# Patient Record
Sex: Female | Born: 1990 | Race: Black or African American | Hispanic: No | Marital: Married | Smoking: Current every day smoker
Health system: Southern US, Community
[De-identification: ages and names within clinical notes are randomized; demographics above are authoritative.]

## PROBLEM LIST (undated history)

## (undated) ENCOUNTER — Inpatient Hospital Stay (HOSPITAL_COMMUNITY): Payer: Self-pay

## (undated) DIAGNOSIS — R87619 Unspecified abnormal cytological findings in specimens from cervix uteri: Secondary | ICD-10-CM

## (undated) DIAGNOSIS — IMO0002 Reserved for concepts with insufficient information to code with codable children: Secondary | ICD-10-CM

## (undated) DIAGNOSIS — Z8619 Personal history of other infectious and parasitic diseases: Secondary | ICD-10-CM

## (undated) DIAGNOSIS — A6000 Herpesviral infection of urogenital system, unspecified: Secondary | ICD-10-CM

## (undated) DIAGNOSIS — O0930 Supervision of pregnancy with insufficient antenatal care, unspecified trimester: Secondary | ICD-10-CM

## (undated) DIAGNOSIS — I2699 Other pulmonary embolism without acute cor pulmonale: Secondary | ICD-10-CM

## (undated) HISTORY — DX: Herpesviral infection of urogenital system, unspecified: A60.00

## (undated) HISTORY — DX: Personal history of other infectious and parasitic diseases: Z86.19

## (undated) HISTORY — DX: Supervision of pregnancy with insufficient antenatal care, unspecified trimester: O09.30

---
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
## 2008-03-04 ENCOUNTER — Emergency Department (HOSPITAL_COMMUNITY): Admission: EM | Admit: 2008-03-04 | Discharge: 2008-03-04 | Payer: Self-pay | Admitting: Family Medicine

## 2008-06-06 ENCOUNTER — Emergency Department (HOSPITAL_COMMUNITY): Admission: EM | Admit: 2008-06-06 | Discharge: 2008-06-06 | Payer: Self-pay | Admitting: Family Medicine

## 2009-04-06 ENCOUNTER — Emergency Department (HOSPITAL_COMMUNITY): Admission: EM | Admit: 2009-04-06 | Discharge: 2009-04-06 | Payer: Self-pay | Admitting: Family Medicine

## 2009-07-17 ENCOUNTER — Emergency Department (HOSPITAL_COMMUNITY): Admission: EM | Admit: 2009-07-17 | Discharge: 2009-07-17 | Payer: Self-pay | Admitting: Emergency Medicine

## 2009-10-17 ENCOUNTER — Inpatient Hospital Stay (HOSPITAL_COMMUNITY): Admission: EM | Admit: 2009-10-17 | Discharge: 2009-10-18 | Payer: Self-pay | Admitting: Emergency Medicine

## 2009-10-17 ENCOUNTER — Ambulatory Visit: Payer: Self-pay | Admitting: Family Medicine

## 2009-10-17 ENCOUNTER — Emergency Department (HOSPITAL_COMMUNITY): Admission: EM | Admit: 2009-10-17 | Discharge: 2009-10-17 | Payer: Self-pay | Admitting: Family Medicine

## 2009-10-18 ENCOUNTER — Ambulatory Visit: Payer: Self-pay | Admitting: Psychiatry

## 2009-10-18 ENCOUNTER — Inpatient Hospital Stay (HOSPITAL_COMMUNITY): Admission: AD | Admit: 2009-10-18 | Discharge: 2009-10-22 | Payer: Self-pay | Admitting: Psychiatry

## 2009-12-03 ENCOUNTER — Ambulatory Visit: Payer: Self-pay | Admitting: Psychiatry

## 2009-12-22 ENCOUNTER — Emergency Department (HOSPITAL_COMMUNITY)
Admission: EM | Admit: 2009-12-22 | Discharge: 2009-12-22 | Payer: Self-pay | Source: Home / Self Care | Admitting: Emergency Medicine

## 2009-12-26 ENCOUNTER — Emergency Department (HOSPITAL_COMMUNITY)
Admission: EM | Admit: 2009-12-26 | Discharge: 2009-12-26 | Payer: Self-pay | Source: Home / Self Care | Admitting: Emergency Medicine

## 2010-01-04 ENCOUNTER — Emergency Department (HOSPITAL_COMMUNITY)
Admission: EM | Admit: 2010-01-04 | Discharge: 2010-01-04 | Payer: Self-pay | Source: Home / Self Care | Admitting: Family Medicine

## 2010-02-23 ENCOUNTER — Emergency Department (HOSPITAL_COMMUNITY)
Admission: EM | Admit: 2010-02-23 | Discharge: 2010-02-23 | Disposition: A | Discharge status: Home / Self Care | Payer: Medicaid Other | Attending: Emergency Medicine | Admitting: Emergency Medicine

## 2010-02-23 DIAGNOSIS — J069 Acute upper respiratory infection, unspecified: Secondary | ICD-10-CM | POA: Insufficient documentation

## 2010-02-23 DIAGNOSIS — J3489 Other specified disorders of nose and nasal sinuses: Secondary | ICD-10-CM | POA: Insufficient documentation

## 2010-03-22 ENCOUNTER — Emergency Department (HOSPITAL_COMMUNITY): Payer: Medicaid Other

## 2010-03-22 ENCOUNTER — Emergency Department (HOSPITAL_COMMUNITY)
Admission: EM | Admit: 2010-03-22 | Discharge: 2010-03-22 | Disposition: A | Discharge status: Home / Self Care | Payer: Medicaid Other | Attending: Internal Medicine | Admitting: Internal Medicine

## 2010-03-22 DIAGNOSIS — R109 Unspecified abdominal pain: Secondary | ICD-10-CM | POA: Insufficient documentation

## 2010-03-22 DIAGNOSIS — M545 Low back pain, unspecified: Secondary | ICD-10-CM | POA: Insufficient documentation

## 2010-03-22 DIAGNOSIS — O99891 Other specified diseases and conditions complicating pregnancy: Secondary | ICD-10-CM | POA: Insufficient documentation

## 2010-03-22 LAB — URINALYSIS, ROUTINE W REFLEX MICROSCOPIC
Hgb urine dipstick: NEGATIVE
Nitrite: NEGATIVE
Specific Gravity, Urine: 1.023 (ref 1.005–1.030)
Urobilinogen, UA: 1 mg/dL (ref 0.0–1.0)

## 2010-03-22 LAB — ABO/RH: ABO/RH(D): B POS

## 2010-03-22 LAB — HCG, QUANTITATIVE, PREGNANCY: hCG, Beta Chain, Quant, S: 92268 m[IU]/mL — ABNORMAL HIGH (ref <5)

## 2010-03-22 LAB — POCT PREGNANCY, URINE: Preg Test, Ur: POSITIVE

## 2010-03-26 ENCOUNTER — Emergency Department (HOSPITAL_COMMUNITY)
Admission: EM | Admit: 2010-03-26 | Discharge: 2010-03-26 | Disposition: A | Discharge status: Home / Self Care | Payer: Medicaid Other | Attending: Emergency Medicine | Admitting: Emergency Medicine

## 2010-03-26 DIAGNOSIS — O99891 Other specified diseases and conditions complicating pregnancy: Secondary | ICD-10-CM | POA: Insufficient documentation

## 2010-03-26 DIAGNOSIS — R109 Unspecified abdominal pain: Secondary | ICD-10-CM | POA: Insufficient documentation

## 2010-03-26 LAB — URINALYSIS, ROUTINE W REFLEX MICROSCOPIC
Bilirubin Urine: NEGATIVE
Glucose, UA: NEGATIVE mg/dL
Ketones, ur: NEGATIVE mg/dL
pH: 8 (ref 5.0–8.0)

## 2010-03-26 LAB — WET PREP, GENITAL: Clue Cells Wet Prep HPF POC: NONE SEEN

## 2010-03-27 ENCOUNTER — Inpatient Hospital Stay (HOSPITAL_COMMUNITY): Payer: Medicaid Other

## 2010-03-27 ENCOUNTER — Inpatient Hospital Stay (HOSPITAL_COMMUNITY)
Admission: AD | Admit: 2010-03-27 | Discharge: 2010-03-27 | Disposition: A | Discharge status: Home / Self Care | Payer: Medicaid Other | Source: Ambulatory Visit | Attending: Obstetrics & Gynecology | Admitting: Obstetrics & Gynecology

## 2010-03-27 DIAGNOSIS — R109 Unspecified abdominal pain: Secondary | ICD-10-CM | POA: Insufficient documentation

## 2010-03-27 DIAGNOSIS — O99891 Other specified diseases and conditions complicating pregnancy: Secondary | ICD-10-CM | POA: Insufficient documentation

## 2010-03-27 DIAGNOSIS — O9989 Other specified diseases and conditions complicating pregnancy, childbirth and the puerperium: Secondary | ICD-10-CM

## 2010-03-27 LAB — URINALYSIS, ROUTINE W REFLEX MICROSCOPIC
Nitrite: NEGATIVE
Specific Gravity, Urine: 1.025 (ref 1.005–1.030)
Urobilinogen, UA: 0.2 mg/dL (ref 0.0–1.0)

## 2010-03-27 LAB — GC/CHLAMYDIA PROBE AMP, GENITAL
Chlamydia, DNA Probe: NEGATIVE
GC Probe Amp, Genital: NEGATIVE

## 2010-04-01 LAB — URINALYSIS, ROUTINE W REFLEX MICROSCOPIC
Bilirubin Urine: NEGATIVE
Hgb urine dipstick: NEGATIVE
Ketones, ur: NEGATIVE mg/dL
Leukocytes, UA: NEGATIVE
Nitrite: NEGATIVE
Nitrite: NEGATIVE
Protein, ur: NEGATIVE mg/dL
Specific Gravity, Urine: 1.014 (ref 1.005–1.030)
Urobilinogen, UA: 0.2 mg/dL (ref 0.0–1.0)
pH: 7 (ref 5.0–8.0)
pH: 7.5 (ref 5.0–8.0)

## 2010-04-01 LAB — URINE CULTURE: Culture  Setup Time: 201112041957

## 2010-04-01 LAB — URINE MICROSCOPIC-ADD ON

## 2010-04-01 LAB — POCT PREGNANCY, URINE: Preg Test, Ur: NEGATIVE

## 2010-04-01 LAB — GC/CHLAMYDIA PROBE AMP, GENITAL: GC Probe Amp, Genital: NEGATIVE

## 2010-04-01 LAB — RPR: RPR Ser Ql: NONREACTIVE

## 2010-04-01 LAB — WET PREP, GENITAL

## 2010-04-03 LAB — DIFFERENTIAL
Basophils Relative: 0 % (ref 0–1)
Basophils Relative: 1 % (ref 0–1)
Eosinophils Absolute: 0.2 10*3/uL (ref 0.0–0.7)
Eosinophils Absolute: 0.3 10*3/uL (ref 0.0–0.7)
Eosinophils Relative: 4 % (ref 0–5)
Eosinophils Relative: 7 % — ABNORMAL HIGH (ref 0–5)
Lymphs Abs: 1.4 10*3/uL (ref 0.7–4.0)
Lymphs Abs: 2 10*3/uL (ref 0.7–4.0)
Monocytes Absolute: 0.3 10*3/uL (ref 0.1–1.0)
Monocytes Relative: 6 % (ref 3–12)
Monocytes Relative: 6 % (ref 3–12)
Neutrophils Relative %: 42 % — ABNORMAL LOW (ref 43–77)

## 2010-04-03 LAB — URINALYSIS, ROUTINE W REFLEX MICROSCOPIC
Glucose, UA: NEGATIVE mg/dL
Hgb urine dipstick: NEGATIVE
Specific Gravity, Urine: 1.014 (ref 1.005–1.030)
pH: 5 (ref 5.0–8.0)

## 2010-04-03 LAB — COMPREHENSIVE METABOLIC PANEL
ALT: 12 U/L (ref 0–35)
ALT: 17 U/L (ref 0–35)
AST: 13 U/L (ref 0–37)
AST: 20 U/L (ref 0–37)
Albumin: 4.2 g/dL (ref 3.5–5.2)
Alkaline Phosphatase: 42 U/L (ref 39–117)
Alkaline Phosphatase: 47 U/L (ref 39–117)
CO2: 26 mEq/L (ref 19–32)
Calcium: 8.5 mg/dL (ref 8.4–10.5)
GFR calc Af Amer: >60 mL/min (ref >60)
GFR calc Af Amer: >60 mL/min (ref >60)
GFR calc non Af Amer: >60 mL/min (ref >60)
Glucose, Bld: 115 mg/dL — ABNORMAL HIGH (ref 70–99)
Glucose, Bld: 97 mg/dL (ref 70–99)
Potassium: 3.6 mEq/L (ref 3.5–5.1)
Potassium: 3.8 mEq/L (ref 3.5–5.1)
Sodium: 138 mEq/L (ref 135–145)
Sodium: 141 mEq/L (ref 135–145)
Total Protein: 5.8 g/dL — ABNORMAL LOW (ref 6.0–8.3)
Total Protein: 6.9 g/dL (ref 6.0–8.3)

## 2010-04-03 LAB — CBC
HCT: 37.8 % (ref 36.0–46.0)
HCT: 39.8 % (ref 36.0–46.0)
Hemoglobin: 12.6 g/dL (ref 12.0–15.0)
Platelets: 231 10*3/uL (ref 150–400)
RDW: 13.3 % (ref 11.5–15.5)
WBC: 4.6 10*3/uL (ref 4.0–10.5)
WBC: 4.9 10*3/uL (ref 4.0–10.5)

## 2010-04-03 LAB — RAPID URINE DRUG SCREEN, HOSP PERFORMED
Amphetamines: NOT DETECTED
Barbiturates: NOT DETECTED
Benzodiazepines: NOT DETECTED
Opiates: NOT DETECTED

## 2010-04-03 LAB — ACETAMINOPHEN LEVEL
Acetaminophen (Tylenol), Serum: 87.8 ug/mL — ABNORMAL HIGH (ref 10–30)
Acetaminophen (Tylenol), Serum: <10 ug/mL — ABNORMAL LOW (ref 10–30)

## 2010-04-03 LAB — GLUCOSE, CAPILLARY

## 2010-04-03 LAB — TSH: TSH: 1.017 u[IU]/mL (ref 0.350–4.500)

## 2010-04-06 LAB — GC/CHLAMYDIA PROBE AMP, GENITAL
Chlamydia, DNA Probe: NEGATIVE
GC Probe Amp, Genital: NEGATIVE

## 2010-04-06 LAB — URINALYSIS, ROUTINE W REFLEX MICROSCOPIC
Glucose, UA: NEGATIVE mg/dL
Hgb urine dipstick: NEGATIVE
pH: 6 (ref 5.0–8.0)

## 2010-04-06 LAB — WET PREP, GENITAL
Trich, Wet Prep: NONE SEEN
Yeast Wet Prep HPF POC: NONE SEEN

## 2010-04-29 LAB — POCT URINALYSIS DIP (DEVICE)
Hgb urine dipstick: NEGATIVE
Ketones, ur: NEGATIVE mg/dL
Protein, ur: NEGATIVE mg/dL
Specific Gravity, Urine: 1.015 (ref 1.005–1.030)
Urobilinogen, UA: 0.2 mg/dL (ref 0.0–1.0)
pH: 6 (ref 5.0–8.0)

## 2010-04-29 LAB — URINE CULTURE: Colony Count: >=100000

## 2010-04-29 LAB — POCT PREGNANCY, URINE: Preg Test, Ur: NEGATIVE

## 2010-05-17 ENCOUNTER — Inpatient Hospital Stay (HOSPITAL_COMMUNITY)
Admission: AD | Admit: 2010-05-17 | Discharge: 2010-05-17 | Disposition: A | Discharge status: Home / Self Care | Payer: Medicaid Other | Source: Ambulatory Visit | Attending: Family Medicine | Admitting: Family Medicine

## 2010-05-17 DIAGNOSIS — N949 Unspecified condition associated with female genital organs and menstrual cycle: Secondary | ICD-10-CM | POA: Insufficient documentation

## 2010-05-17 DIAGNOSIS — O98519 Other viral diseases complicating pregnancy, unspecified trimester: Secondary | ICD-10-CM

## 2010-05-17 DIAGNOSIS — A6000 Herpesviral infection of urogenital system, unspecified: Secondary | ICD-10-CM

## 2010-05-17 LAB — CBC
MCH: 30.4 pg (ref 26.0–34.0)
MCV: 84.3 fL (ref 78.0–100.0)
Platelets: 178 10*3/uL (ref 150–400)
RDW: 13.4 % (ref 11.5–15.5)
WBC: 6.1 10*3/uL (ref 4.0–10.5)

## 2010-05-17 LAB — URINALYSIS, ROUTINE W REFLEX MICROSCOPIC
Hgb urine dipstick: NEGATIVE
Nitrite: NEGATIVE
Protein, ur: NEGATIVE mg/dL
Specific Gravity, Urine: 1.025 (ref 1.005–1.030)
Urobilinogen, UA: 1 mg/dL (ref 0.0–1.0)

## 2010-07-24 ENCOUNTER — Emergency Department (HOSPITAL_COMMUNITY)
Admission: EM | Admit: 2010-07-24 | Discharge: 2010-07-24 | Disposition: A | Discharge status: Home / Self Care | Payer: Medicaid Other | Attending: Emergency Medicine | Admitting: Emergency Medicine

## 2010-07-24 DIAGNOSIS — R109 Unspecified abdominal pain: Secondary | ICD-10-CM | POA: Insufficient documentation

## 2010-07-24 DIAGNOSIS — O99891 Other specified diseases and conditions complicating pregnancy: Secondary | ICD-10-CM | POA: Insufficient documentation

## 2010-07-24 LAB — URINALYSIS, ROUTINE W REFLEX MICROSCOPIC
Bilirubin Urine: NEGATIVE
Glucose, UA: NEGATIVE mg/dL
Ketones, ur: NEGATIVE mg/dL
Leukocytes, UA: NEGATIVE
Specific Gravity, Urine: 1.017 (ref 1.005–1.030)
pH: 6.5 (ref 5.0–8.0)

## 2010-07-24 LAB — RAPID URINE DRUG SCREEN, HOSP PERFORMED
Amphetamines: NOT DETECTED
Benzodiazepines: NOT DETECTED
Cocaine: NOT DETECTED

## 2010-07-24 LAB — BASIC METABOLIC PANEL WITH GFR
BUN: 8 mg/dL (ref 6–23)
CO2: 24 meq/L (ref 19–32)
Calcium: 9.3 mg/dL (ref 8.4–10.5)
Chloride: 104 meq/L (ref 96–112)
Creatinine, Ser: <0.47 mg/dL — ABNORMAL LOW (ref 0.50–1.10)
Glucose, Bld: 100 mg/dL — ABNORMAL HIGH (ref 70–99)
Potassium: 3.7 meq/L (ref 3.5–5.1)
Sodium: 138 meq/L (ref 135–145)

## 2010-07-24 LAB — CBC
Hemoglobin: 9.6 g/dL — ABNORMAL LOW (ref 12.0–15.0)
MCHC: 35 g/dL (ref 30.0–36.0)
RDW: 13.6 % (ref 11.5–15.5)
WBC: 5.9 10*3/uL (ref 4.0–10.5)

## 2010-07-25 LAB — URINE CULTURE

## 2010-08-20 LAB — GC/CHLAMYDIA PROBE AMP, GENITAL: Gonorrhea: NEGATIVE

## 2010-08-20 LAB — HIV ANTIBODY (ROUTINE TESTING W REFLEX): HIV: NONREACTIVE

## 2010-08-20 LAB — ANTIBODY SCREEN: Antibody Screen: NEGATIVE

## 2010-10-13 LAB — DIFFERENTIAL
Basophils Absolute: 0
Basophils Relative: 0
Eosinophils Absolute: 0
Eosinophils Relative: 0
Neutrophils Relative %: 91 — ABNORMAL HIGH

## 2010-10-13 LAB — CBC
Hemoglobin: 13.9
MCHC: 34.1
RBC: 4.88
WBC: 10.4

## 2010-10-13 LAB — URINALYSIS, ROUTINE W REFLEX MICROSCOPIC
Ketones, ur: 15 — AB
Protein, ur: 100 — AB
Urobilinogen, UA: 0.2

## 2010-10-13 LAB — COMPREHENSIVE METABOLIC PANEL
ALT: 12
AST: 21
Alkaline Phosphatase: 60
CO2: 23
Calcium: 9.2
Chloride: 103
GFR calc non Af Amer: >60
Glucose, Bld: 141 — ABNORMAL HIGH
Potassium: 3.1 — ABNORMAL LOW
Sodium: 136
Total Bilirubin: 1.6 — ABNORMAL HIGH

## 2010-10-13 LAB — URINE MICROSCOPIC-ADD ON

## 2010-10-13 LAB — URINE CULTURE: Colony Count: >=100000

## 2010-10-13 LAB — POCT PREGNANCY, URINE
Operator id: 151321
Preg Test, Ur: NEGATIVE

## 2010-10-24 LAB — POCT URINALYSIS DIP (DEVICE)
Ketones, ur: NEGATIVE mg/dL
Protein, ur: NEGATIVE mg/dL
Specific Gravity, Urine: 1.015 (ref 1.005–1.030)
Urobilinogen, UA: 2 mg/dL — ABNORMAL HIGH (ref 0.0–1.0)
pH: 7 (ref 5.0–8.0)

## 2010-10-24 LAB — WET PREP, GENITAL: Trich, Wet Prep: NONE SEEN

## 2010-10-24 LAB — GC/CHLAMYDIA PROBE AMP, GENITAL: GC Probe Amp, Genital: NEGATIVE

## 2010-10-24 LAB — POCT PREGNANCY, URINE: Preg Test, Ur: NEGATIVE

## 2010-10-27 ENCOUNTER — Encounter (HOSPITAL_COMMUNITY): Payer: Self-pay | Admitting: *Deleted

## 2010-10-27 ENCOUNTER — Telehealth (HOSPITAL_COMMUNITY): Payer: Self-pay | Admitting: *Deleted

## 2010-10-27 NOTE — Telephone Encounter (Signed)
Preadmission screen  

## 2010-10-29 ENCOUNTER — Encounter (HOSPITAL_COMMUNITY): Payer: Self-pay | Admitting: Anesthesiology

## 2010-10-29 ENCOUNTER — Inpatient Hospital Stay (HOSPITAL_COMMUNITY)
Admission: RE | Admit: 2010-10-29 | Discharge: 2010-10-31 | DRG: 775 | Disposition: A | Discharge status: Home / Self Care | Payer: Medicaid Other | Source: Ambulatory Visit | Attending: Obstetrics and Gynecology | Admitting: Obstetrics and Gynecology

## 2010-10-29 ENCOUNTER — Inpatient Hospital Stay (HOSPITAL_COMMUNITY): Payer: Medicaid Other | Admitting: Anesthesiology

## 2010-10-29 ENCOUNTER — Encounter (HOSPITAL_COMMUNITY): Payer: Self-pay

## 2010-10-29 DIAGNOSIS — Z3490 Encounter for supervision of normal pregnancy, unspecified, unspecified trimester: Secondary | ICD-10-CM

## 2010-10-29 DIAGNOSIS — O99892 Other specified diseases and conditions complicating childbirth: Principal | ICD-10-CM | POA: Diagnosis present

## 2010-10-29 DIAGNOSIS — Z22330 Carrier of Group B streptococcus: Secondary | ICD-10-CM

## 2010-10-29 HISTORY — DX: Unspecified abnormal cytological findings in specimens from cervix uteri: R87.619

## 2010-10-29 HISTORY — DX: Reserved for concepts with insufficient information to code with codable children: IMO0002

## 2010-10-29 LAB — CBC
MCHC: 34 g/dL (ref 30.0–36.0)
Platelets: 156 10*3/uL (ref 150–400)
RDW: 13.9 % (ref 11.5–15.5)

## 2010-10-29 LAB — RPR: RPR Ser Ql: NONREACTIVE

## 2010-10-29 MED ORDER — TERBUTALINE SULFATE 1 MG/ML IJ SOLN: 0.2500 mg | Freq: Once | INTRAMUSCULAR | Status: DC | PRN

## 2010-10-29 MED ORDER — OXYTOCIN 20 UNITS IN LACTATED RINGERS INFUSION - SIMPLE: 125.0000 mL/h | INTRAVENOUS | Status: DC | PRN

## 2010-10-29 MED ORDER — PRENATAL PLUS 27-1 MG PO TABS
1.0000 | ORAL_TABLET | Freq: Every day | ORAL | Status: DC
  Administered 2010-10-30 – 2010-10-31 (×2): 1 via ORAL
  Filled 2010-10-29 (×2): qty 1

## 2010-10-29 MED ORDER — OXYTOCIN 20 UNITS IN LACTATED RINGERS INFUSION - SIMPLE: 125.0000 mL/h | Freq: Once | INTRAVENOUS | Status: DC

## 2010-10-29 MED ORDER — DIPHENHYDRAMINE HCL 50 MG/ML IJ SOLN: 12.5000 mg | INTRAMUSCULAR | Status: DC | PRN

## 2010-10-29 MED ORDER — OXYTOCIN 20 UNITS IN LACTATED RINGERS INFUSION - SIMPLE
1.0000 m[IU]/min | INTRAVENOUS | Status: DC
  Administered 2010-10-29: 1 m[IU]/min via INTRAVENOUS

## 2010-10-29 MED ORDER — DIBUCAINE 1 % RE OINT
1.0000 | TOPICAL_OINTMENT | RECTAL | Status: DC | PRN
Start: 2010-10-29 — End: 2010-10-31

## 2010-10-29 MED ORDER — PENICILLIN G POTASSIUM 5000000 UNITS IJ SOLR
5.0000 10*6.[IU] | Freq: Once | INTRAVENOUS | Status: AC
  Administered 2010-10-29: 5 10*6.[IU] via INTRAVENOUS
  Filled 2010-10-29: qty 5

## 2010-10-29 MED ORDER — ONDANSETRON HCL 4 MG/2ML IJ SOLN: 4.0000 mg | INTRAMUSCULAR | Status: DC | PRN

## 2010-10-29 MED ORDER — DIPHENHYDRAMINE HCL 25 MG PO CAPS
50.0000 mg | ORAL_CAPSULE | Freq: Four times a day (QID) | ORAL | Status: DC | PRN
  Administered 2010-10-29: 25 mg via ORAL
  Filled 2010-10-29: qty 1

## 2010-10-29 MED ORDER — SENNOSIDES-DOCUSATE SODIUM 8.6-50 MG PO TABS
2.0000 | ORAL_TABLET | Freq: Every day | ORAL | Status: DC
  Administered 2010-10-30: 2 via ORAL

## 2010-10-29 MED ORDER — TETANUS-DIPHTH-ACELL PERTUSSIS 5-2.5-18.5 LF-MCG/0.5 IM SUSP: 0.5000 mL | Freq: Once | INTRAMUSCULAR | Status: DC

## 2010-10-29 MED ORDER — FENTANYL 2.5 MCG/ML BUPIVACAINE 1/10 % EPIDURAL INFUSION (WH - ANES)
14.0000 mL/h | INTRAMUSCULAR | Status: DC
  Administered 2010-10-29 (×2): 14 mL/h via EPIDURAL
  Filled 2010-10-29 (×2): qty 60

## 2010-10-29 MED ORDER — MEASLES, MUMPS & RUBELLA VAC ~~LOC~~ INJ: 0.5000 mL | INJECTION | Freq: Once | SUBCUTANEOUS | Status: DC

## 2010-10-29 MED ORDER — OXYTOCIN BOLUS FROM INFUSION
500.0000 mL | Freq: Once | INTRAVENOUS | Status: DC
  Filled 2010-10-29: qty 500
  Filled 2010-10-29: qty 1000

## 2010-10-29 MED ORDER — LANOLIN HYDROUS EX OINT: TOPICAL_OINTMENT | CUTANEOUS | Status: DC | PRN

## 2010-10-29 MED ORDER — BENZOCAINE-MENTHOL 20-0.5 % EX AERO: 1.0000 "application " | INHALATION_SPRAY | CUTANEOUS | Status: DC | PRN

## 2010-10-29 MED ORDER — PHENYLEPHRINE 40 MCG/ML (10ML) SYRINGE FOR IV PUSH (FOR BLOOD PRESSURE SUPPORT)
80.0000 ug | PREFILLED_SYRINGE | INTRAVENOUS | Status: DC | PRN
  Filled 2010-10-29 (×2): qty 5

## 2010-10-29 MED ORDER — WITCH HAZEL-GLYCERIN EX PADS: 1.0000 "application " | MEDICATED_PAD | CUTANEOUS | Status: DC | PRN

## 2010-10-29 MED ORDER — IBUPROFEN 600 MG PO TABS
600.0000 mg | ORAL_TABLET | Freq: Four times a day (QID) | ORAL | Status: DC
  Administered 2010-10-30 – 2010-10-31 (×7): 600 mg via ORAL
  Filled 2010-10-29 (×6): qty 1

## 2010-10-29 MED ORDER — EPHEDRINE 5 MG/ML INJ
10.0000 mg | INTRAVENOUS | Status: DC | PRN
  Filled 2010-10-29 (×2): qty 4

## 2010-10-29 MED ORDER — MAGNESIUM HYDROXIDE 400 MG/5ML PO SUSP: 30.0000 mL | ORAL | Status: DC | PRN

## 2010-10-29 MED ORDER — METHYLERGONOVINE MALEATE 0.2 MG PO TABS: 0.2000 mg | ORAL_TABLET | ORAL | Status: DC | PRN

## 2010-10-29 MED ORDER — DIPHENHYDRAMINE HCL 25 MG PO CAPS
25.0000 mg | ORAL_CAPSULE | Freq: Four times a day (QID) | ORAL | Status: DC | PRN
  Administered 2010-10-29: 25 mg via ORAL
  Filled 2010-10-29: qty 1

## 2010-10-29 MED ORDER — CITRIC ACID-SODIUM CITRATE 334-500 MG/5ML PO SOLN: 30.0000 mL | ORAL | Status: DC | PRN

## 2010-10-29 MED ORDER — BUTORPHANOL TARTRATE 2 MG/ML IJ SOLN
INTRAMUSCULAR | Status: AC
  Filled 2010-10-29: qty 1

## 2010-10-29 MED ORDER — BUTORPHANOL TARTRATE 2 MG/ML IJ SOLN
1.0000 mg | Freq: Once | INTRAMUSCULAR | Status: AC
  Administered 2010-10-29: 1 mg via INTRAVENOUS

## 2010-10-29 MED ORDER — SIMETHICONE 80 MG PO CHEW: 80.0000 mg | CHEWABLE_TABLET | ORAL | Status: DC | PRN

## 2010-10-29 MED ORDER — ONDANSETRON HCL 4 MG PO TABS: 4.0000 mg | ORAL_TABLET | ORAL | Status: DC | PRN

## 2010-10-29 MED ORDER — LIDOCAINE HCL (PF) 1 % IJ SOLN
30.0000 mL | INTRAMUSCULAR | Status: DC | PRN
  Filled 2010-10-29 (×2): qty 30

## 2010-10-29 MED ORDER — OXYCODONE-ACETAMINOPHEN 5-325 MG PO TABS: 2.0000 | ORAL_TABLET | ORAL | Status: DC | PRN

## 2010-10-29 MED ORDER — ACETAMINOPHEN 325 MG PO TABS: 650.0000 mg | ORAL_TABLET | ORAL | Status: DC | PRN

## 2010-10-29 MED ORDER — ONDANSETRON HCL 4 MG/2ML IJ SOLN: 4.0000 mg | Freq: Four times a day (QID) | INTRAMUSCULAR | Status: DC | PRN

## 2010-10-29 MED ORDER — LACTATED RINGERS IV SOLN
INTRAVENOUS | Status: DC
  Administered 2010-10-29 (×2): via INTRAVENOUS
  Administered 2010-10-29: 600 mL/h via INTRAVENOUS

## 2010-10-29 MED ORDER — ZOLPIDEM TARTRATE 5 MG PO TABS: 5.0000 mg | ORAL_TABLET | Freq: Every evening | ORAL | Status: DC | PRN

## 2010-10-29 MED ORDER — LIDOCAINE HCL 1.5 % IJ SOLN
INTRAMUSCULAR | Status: DC | PRN
  Administered 2010-10-29: 5 mL via INTRADERMAL
  Administered 2010-10-29: 2 mL via INTRADERMAL
  Administered 2010-10-29: 5 mL via INTRADERMAL

## 2010-10-29 MED ORDER — IBUPROFEN 600 MG PO TABS: 600.0000 mg | ORAL_TABLET | Freq: Four times a day (QID) | ORAL | Status: DC | PRN

## 2010-10-29 MED ORDER — METHYLERGONOVINE MALEATE 0.2 MG/ML IJ SOLN: 0.2000 mg | INTRAMUSCULAR | Status: DC | PRN

## 2010-10-29 MED ORDER — DEXTROSE 5 % IV SOLN
2.5000 10*6.[IU] | INTRAVENOUS | Status: DC
  Administered 2010-10-29: 2.5 10*6.[IU] via INTRAVENOUS
  Filled 2010-10-29 (×5): qty 2.5

## 2010-10-29 MED ORDER — LACTATED RINGERS IV SOLN: 500.0000 mL | Freq: Once | INTRAVENOUS | Status: DC

## 2010-10-29 MED ORDER — PHENYLEPHRINE 40 MCG/ML (10ML) SYRINGE FOR IV PUSH (FOR BLOOD PRESSURE SUPPORT)
80.0000 ug | PREFILLED_SYRINGE | INTRAVENOUS | Status: DC | PRN
  Filled 2010-10-29: qty 5

## 2010-10-29 MED ORDER — EPHEDRINE 5 MG/ML INJ
10.0000 mg | INTRAVENOUS | Status: DC | PRN
  Filled 2010-10-29: qty 4

## 2010-10-29 MED ORDER — LACTATED RINGERS IV SOLN: 500.0000 mL | INTRAVENOUS | Status: DC | PRN

## 2010-10-29 MED ORDER — OXYCODONE-ACETAMINOPHEN 5-325 MG PO TABS: 1.0000 | ORAL_TABLET | ORAL | Status: DC | PRN

## 2010-10-29 NOTE — Procedures (Signed)
Delivery Note Pt entered active labor and received an epidural.  She progressed to complete and pushed well.  SVD viable female, Apgars and weight pending.  Placenta spontaneous and intact.  Perineum intact, EBL 400 cc.

## 2010-10-29 NOTE — Anesthesia Procedure Notes (Signed)
Epidural Patient location during procedure: OB Start time: 10/29/2010 3:46 PM Reason for block: procedure for pain  Staffing Performed by: anesthesiologist   Preanesthetic Checklist Completed: patient identified, site marked, surgical consent, pre-op evaluation, timeout performed, IV checked, risks and benefits discussed and monitors and equipment checked  Epidural Patient position: sitting Prep: site prepped and draped and DuraPrep Patient monitoring: continuous pulse ox and blood pressure Approach: midline Injection technique: LOR air  Needle:  Needle type: Tuohy  Needle gauge: 17 G Needle length: 9 cm Needle insertion depth: 5 cm cm Catheter type: closed end flexible Catheter size: 19 Gauge Catheter at skin depth: 10 cm Test dose: negative  Assessment Events: blood not aspirated, injection not painful, no injection resistance, negative IV test and no paresthesia

## 2010-10-29 NOTE — H&P (Signed)
Sonya Foster is a 20 y.o. female, G3 P2002, presenting for elective induction at 40 weeks.  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  See prenatal records for complete history . Maternal Medical History:  Fetal activity: Perceived fetal activity is normal.    Prenatal complications: no prenatal complications   OB History    Grav Para Term Preterm Abortions TAB SAB Ect Mult Living   3 2 2       2      Past Medical History  Diagnosis Date  . Genital HSV   . History of chlamydia   . Late prenatal care   . Abnormal Pap smear    History reviewed. No pertinent past surgical history. Family History: family history includes Diabetes in her maternal grandfather and maternal grandmother and Hypertension in her maternal grandfather and maternal grandmother. Social History:  reports that she has never smoked. She does not have any smokeless tobacco history on file. She reports that she does not drink alcohol or use illicit drugs.  Review of Systems  Respiratory: Negative.   Cardiovascular: Negative.    AROM- clear Dilation: 5.5 Effacement (%): 70 Station: -1 Exam by:: mesinger Blood pressure 118/71, pulse 78, temperature 97.6 F (36.4 C), temperature source Oral, resp. rate 20, height 5\' 3"  (1.6 m), weight 78.472 kg (173 lb), last menstrual period 01/21/2010. Maternal Exam:  Uterine Assessment: Contraction strength is moderate.  Contraction frequency is irregular.   Abdomen: Patient reports no abdominal tenderness. Estimated fetal weight is 7 1/2 lbs.   Fetal presentation: vertex  Introitus: Normal vulva. Vulva is negative for lesion.  Normal vagina.    Fetal Exam Fetal Monitor Review: Mode: ultrasound.   Baseline rate: 140s.  Variability: moderate (6-25 bpm).   Pattern: accelerations present and no decelerations.    Fetal State Assessment: Category I - tracings are normal.     Physical Exam  Constitutional: She appears well-developed  and well-nourished.  Neck: Neck supple. No thyromegaly present.  Cardiovascular: Normal rate, regular rhythm and normal heart sounds.   No murmur heard. Respiratory: Effort normal and breath sounds normal. No respiratory distress.  Genitourinary: Vulva exhibits no lesion.    Prenatal labs: ABO, Rh: --/--/B POS (03/03 1701) Antibody: Negative (08/01 0000) Rubella: Immune (08/01 0000) RPR: Nonreactive (08/01 0000)  HBsAg: Negative (08/01 0000)  HIV: Non-reactive (08/01 0000)  GBS: Positive (09/04 0000)   Assessment/Plan: IUP at 40 weeks for elective induction.  On pitocin, AROM just done, on PCN for +GBS.  Will monitor progress.   Sonya Foster D 10/29/2010, 1:48 PM

## 2010-10-29 NOTE — Anesthesia Preprocedure Evaluation (Signed)
Anesthesia Evaluation  Name, MR# and DOB Patient awake  General Assessment Comment  Reviewed: Allergy & Precautions, H&P , NPO status , Patient's Chart, lab work & pertinent test results, reviewed documented beta blocker date and time   History of Anesthesia Complications Negative for: history of anesthetic complications  Airway Mallampati: I TM Distance: >3 FB Neck ROM: full    Dental  (+) Teeth Intact   Pulmonary  clear to auscultation        Cardiovascular regular Normal    Neuro/Psych Negative Neurological ROS  Negative Psych ROS   GI/Hepatic negative GI ROS Neg liver ROS    Endo/Other  Negative Endocrine ROS  Renal/GU negative Renal ROS     Musculoskeletal   Abdominal   Peds  Hematology negative hematology ROS (+)   Anesthesia Other Findings   Reproductive/Obstetrics (+) Pregnancy                           Anesthesia Physical Anesthesia Plan  ASA: II  Anesthesia Plan: Epidural   Post-op Pain Management:    Induction:   Airway Management Planned:   Additional Equipment:   Intra-op Plan:   Post-operative Plan:   Informed Consent: I have reviewed the patients History and Physical, chart, labs and discussed the procedure including the risks, benefits and alternatives for the proposed anesthesia with the patient or authorized representative who has indicated his/her understanding and acceptance.     Plan Discussed with:   Anesthesia Plan Comments:         Anesthesia Quick Evaluation  

## 2010-10-30 NOTE — Progress Notes (Signed)
UR Chart review completed.  

## 2010-10-30 NOTE — Progress Notes (Signed)
PPD #1 No problems Afeb, VSS Fundus firm, NT at U-0 Continue routine postpartum care 

## 2010-10-30 NOTE — Anesthesia Postprocedure Evaluation (Signed)
  Anesthesia Post-op Note  Patient: Sonya Foster  Procedure(s) Performed: * No procedures listed *  Patient Location: Mother/Baby  Anesthesia Type: Epidural  Level of Consciousness: awake, alert  and oriented  Airway and Oxygen Therapy: Patient Spontanous Breathing  Post-op Pain: none  Post-op Assessment: Post-op Vital signs reviewed, Patient's Cardiovascular Status Stable, No headache, No backache, No residual numbness and No residual motor weakness  Post-op Vital Signs: Reviewed and stable  Complications: No apparent anesthesia complications

## 2010-10-31 NOTE — Progress Notes (Signed)
PPD #2 No problems Afeb, VSS Fundus firm, NT at U-1 D/c home 

## 2010-10-31 NOTE — Discharge Summary (Signed)
Obstetric Discharge Summary Reason for Admission: induction of labor Prenatal Procedures: none Intrapartum Procedures: spontaneous vaginal delivery Postpartum Procedures: none Complications-Operative and Postpartum: none Hemoglobin  Date Value Range Status  10/29/2010 11.8* 12.0-15.0 (g/dL) Final     HCT  Date Value Range Status  10/29/2010 34.7* 36.0-46.0 (%) Final    Discharge Diagnoses: Term Pregnancy-delivered  Discharge Information: Date: 10/31/2010 Activity: pelvic rest Diet: routine Medications: Ibuprofen Condition: stable Instructions: refer to practice specific booklet Discharge to: home Follow-up Information    Follow up with Jerek Meulemans D, MD. Make an appointment in 6 weeks.   Contact information:   [address] [place] [address] [phone]          Newborn Data: Live born female  Birth Weight: 9 lb 14.9 oz (4505 g) APGAR: 9, 9  Home with mother.  Al Gagen D 10/31/2010, 8:49 AM

## 2010-11-04 ENCOUNTER — Encounter (HOSPITAL_COMMUNITY): Payer: Medicaid Other

## 2012-03-22 ENCOUNTER — Encounter (HOSPITAL_COMMUNITY): Payer: Self-pay | Admitting: *Deleted

## 2012-03-22 ENCOUNTER — Emergency Department (HOSPITAL_COMMUNITY)
Admission: EM | Admit: 2012-03-22 | Discharge: 2012-03-22 | Disposition: A | Discharge status: Home / Self Care | Payer: Self-pay | Attending: Emergency Medicine | Admitting: Emergency Medicine

## 2012-03-22 DIAGNOSIS — K047 Periapical abscess without sinus: Secondary | ICD-10-CM | POA: Insufficient documentation

## 2012-03-22 DIAGNOSIS — Z8619 Personal history of other infectious and parasitic diseases: Secondary | ICD-10-CM | POA: Insufficient documentation

## 2012-03-22 DIAGNOSIS — R220 Localized swelling, mass and lump, head: Secondary | ICD-10-CM | POA: Insufficient documentation

## 2012-03-22 MED ORDER — HYDROCODONE-ACETAMINOPHEN 5-325 MG PO TABS: 1.0000 | ORAL_TABLET | Freq: Four times a day (QID) | ORAL | Status: DC | PRN

## 2012-03-22 MED ORDER — PENICILLIN V POTASSIUM 500 MG PO TABS: 500.0000 mg | ORAL_TABLET | Freq: Four times a day (QID) | ORAL | Status: DC

## 2012-03-22 NOTE — ED Notes (Signed)
Pt went to bed last night with toothache right top jaw; woke up this morning with swelling right cheek

## 2012-03-22 NOTE — ED Provider Notes (Signed)
History     CSN: [id number]  Arrival date & time 03/22/12  4098   First MD Initiated Contact with Patient 03/22/12 [phone]      Chief Complaint  Patient presents with  . Dental Pain    (Consider location/radiation/quality/duration/timing/severity/associated sxs/prior treatment) HPI Patient presents to the emergency department with dental pain that began 3 days ago.  Patient, states, that she woke up this morning with swelling over the right upper portion of her jaw.  Patient, states, that this tooth has been damaged for quite a while.  Patient denies nausea, vomiting, fevers, difficulty swallowing, swelling under her tongue, Neck swelling, or difficulty breathing.  Patient, states she did not take anything prior to arrival for her symptoms.  Patient, states, that palpation makes the pain, worse. Past Medical History  Diagnosis Date  . Genital HSV   . History of chlamydia   . Late prenatal care   . Abnormal Pap smear     History reviewed. No pertinent past surgical history.  Family History  Problem Relation Age of Onset  . Diabetes Maternal Grandmother   . Hypertension Maternal Grandmother   . Diabetes Maternal Grandfather   . Hypertension Maternal Grandfather     History  Substance Use Topics  . Smoking status: Never Smoker   . Smokeless tobacco: Not on file  . Alcohol Use: No    OB History   Grav Para Term Preterm Abortions TAB SAB Ect Mult Living   3 3 3       3       Review of Systems All other systems negative except as documented in the HPI. All pertinent positives and negatives as reviewed in the HPI. Allergies  Review of patient's allergies indicates no known allergies.  Home Medications   Current Outpatient Rx  Name  Route  Sig  Dispense  Refill  . prenatal vitamin w/FE, FA (PRENATAL 1 + 1) 27-1 MG TABS   Oral   Take 1 tablet by mouth daily.             BP 125/82  Pulse 81  Temp(Src) 98 F (36.7 C)  Resp 20  SpO2 100%  LMP  03/15/2012  Physical Exam  Nursing note and vitals reviewed. Constitutional: She is oriented to person, place, and time. She appears well-developed and well-nourished. No distress.  HENT:  Head: Normocephalic and atraumatic.    Mouth/Throat:    Neck: Normal range of motion. Neck supple.  Pulmonary/Chest: Effort normal.  Neurological: She is alert and oriented to person, place, and time.  Skin: Skin is warm and dry.    ED Course  Procedures (including critical care time)  Patient is referred to the on-call dentist.  She is advised to return here as needed for any worsening in her condition.  Patient has no significant signs of airway compromise or neck swelling.   MDM          Carlyle Dolly, PA-C 03/22/12 [phone]

## 2012-03-22 NOTE — ED Provider Notes (Signed)
Medical screening examination/treatment/procedure(s) were performed by non-physician practitioner and as supervising physician I was immediately available for consultation/collaboration.  Ethelda Chick, MD 03/22/12 [phone]

## 2012-12-16 ENCOUNTER — Encounter (HOSPITAL_COMMUNITY): Payer: Self-pay | Admitting: Emergency Medicine

## 2012-12-16 ENCOUNTER — Emergency Department (HOSPITAL_COMMUNITY)
Admission: EM | Admit: 2012-12-16 | Discharge: 2012-12-16 | Disposition: A | Discharge status: Home / Self Care | Payer: Medicaid Other | Attending: Emergency Medicine | Admitting: Emergency Medicine

## 2012-12-16 DIAGNOSIS — Z3202 Encounter for pregnancy test, result negative: Secondary | ICD-10-CM | POA: Insufficient documentation

## 2012-12-16 DIAGNOSIS — N760 Acute vaginitis: Secondary | ICD-10-CM | POA: Insufficient documentation

## 2012-12-16 DIAGNOSIS — B9689 Other specified bacterial agents as the cause of diseases classified elsewhere: Secondary | ICD-10-CM

## 2012-12-16 DIAGNOSIS — Z8619 Personal history of other infectious and parasitic diseases: Secondary | ICD-10-CM | POA: Insufficient documentation

## 2012-12-16 LAB — URINALYSIS, ROUTINE W REFLEX MICROSCOPIC
Bilirubin Urine: NEGATIVE
Hgb urine dipstick: NEGATIVE
Ketones, ur: NEGATIVE mg/dL
Nitrite: NEGATIVE
Urobilinogen, UA: 1 mg/dL (ref 0.0–1.0)
pH: 6 (ref 5.0–8.0)

## 2012-12-16 LAB — WET PREP, GENITAL: Trich, Wet Prep: NONE SEEN

## 2012-12-16 LAB — POCT PREGNANCY, URINE: Preg Test, Ur: NEGATIVE

## 2012-12-16 MED ORDER — METRONIDAZOLE 500 MG PO TABS: 500.0000 mg | ORAL_TABLET | Freq: Two times a day (BID) | ORAL | Status: DC

## 2012-12-16 MED ORDER — CEFTRIAXONE SODIUM 250 MG IJ SOLR
250.0000 mg | Freq: Once | INTRAMUSCULAR | Status: AC
  Administered 2012-12-16: 250 mg via INTRAMUSCULAR
  Filled 2012-12-16: qty 250

## 2012-12-16 MED ORDER — LIDOCAINE HCL (PF) 1 % IJ SOLN
INTRAMUSCULAR | Status: AC
  Filled 2012-12-16: qty 5

## 2012-12-16 MED ORDER — AZITHROMYCIN 250 MG PO TABS
1000.0000 mg | ORAL_TABLET | Freq: Once | ORAL | Status: AC
  Administered 2012-12-16: 1000 mg via ORAL
  Filled 2012-12-16: qty 4

## 2012-12-16 NOTE — ED Notes (Signed)
Pt reports her husband tested positive for gonorhea and wants to get tested. Denies vaginal discharge/pain/itchiness. Nad, skin warm and dry, resp e/u. Pt is tearful about situation.

## 2012-12-16 NOTE — ED Notes (Signed)
Pt sts her husband was diagnosed with gonorrhea and now here to be checked; pt denies vaginal discharge or complaint

## 2012-12-16 NOTE — ED Notes (Signed)
Pelvic cart set up at bedside. Pt is in gown with clothes from waist down off. Explained procedure to pt.

## 2012-12-16 NOTE — ED Provider Notes (Signed)
TIME SEEN: 6:01 PM  CHIEF COMPLAINT: "I think my husband gave me gonorrhea"  HPI: Patient is a 22 y.o. G3 P3 with a history of herpes and Chlamydia who presents emergency department with concerns that her husband gave her gonorrhea. She reports that she was contacted by his mother last week and was told that he was diagnosed with gonorrhea. She states she's only been sexually active with her husband and does not use protection. She denies any abdominal pain, nausea, vomiting, diarrhea, dysuria or hematuria, vaginal bleeding or discharge. Her last menstrual period was November 15.  ROS: See HPI Constitutional: no fever  Eyes: no drainage  ENT: no runny nose   Cardiovascular:  no chest pain  Resp: no SOB  GI: no vomiting GU: no dysuria Integumentary: no rash  Allergy: no hives  Musculoskeletal: no leg swelling  Neurological: no slurred speech ROS otherwise negative  PAST MEDICAL HISTORY/PAST SURGICAL HISTORY:  Past Medical History  Diagnosis Date  . Genital HSV   . History of chlamydia   . Late prenatal care   . Abnormal Pap smear     MEDICATIONS:  Prior to Admission medications   Not on File    ALLERGIES:  No Known Allergies  SOCIAL HISTORY:  History  Substance Use Topics  . Smoking status: Never Smoker   . Smokeless tobacco: Not on file  . Alcohol Use: No    FAMILY HISTORY: Family History  Problem Relation Age of Onset  . Diabetes Maternal Grandmother   . Hypertension Maternal Grandmother   . Diabetes Maternal Grandfather   . Hypertension Maternal Grandfather     EXAM: BP 121/74  Pulse 119  Temp(Src) 98.6 F (37 C) (Oral)  Resp 20  Wt 119 lb 8 oz (54.205 kg)  SpO2 100% CONSTITUTIONAL: Alert and oriented and responds appropriately to questions. Well-appearing; well-nourished, tearful HEAD: Normocephalic EYES: Conjunctivae clear, PERRL ENT: normal nose; no rhinorrhea; moist mucous membranes; pharynx without lesions noted NECK: Supple, no meningismus, no  LAD  CARD: RRR; S1 and S2 appreciated; no murmurs, no clicks, no rubs, no gallops RESP: Normal chest excursion without splinting or tachypnea; breath sounds clear and equal bilaterally; no wheezes, no rhonchi, no rales,  ABD/GI: Normal bowel sounds; non-distended; soft, non-tender, no rebound, no guarding PELVIC:  Normal external genitalia, no genital lesions, minimal amount of white, thin vaginal discharge without odor, no cervical motion tenderness, no adnexal tenderness or fullness, no vaginal bleeding BACK:  The back appears normal and is non-tender to palpation, there is no CVA tenderness EXT: Normal ROM in all joints; non-tender to palpation; no edema; normal capillary refill; no cyanosis    SKIN: Normal color for age and race; warm NEURO: Moves all extremities equally PSYCH: The patient's mood and manner are appropriate. Grooming and personal hygiene are appropriate.  MEDICAL DECISION MAKING: Patient here with exposure to gonorrhea. Her exam is completely benign. She was initially tachycardic but I believe this is due to anxiety and being upset.  Abdominal exam is unremarkable. Pelvic exam is unremarkable. Will treat for gonorrhea and Chlamydia. Patient has requested further testing including syphilis and HIV. Wet prep pending.  ED PROGRESS: Patient is negative for Trichomonas. Wet prep showed a few clue cells. Will discharge home with prescription for Flagyl. The patient's urine pregnancy test is negative. Given return precautions. Patient verbalized understanding and is comfortable with plan.     Layla Maw Jessilyn Catino, DO 12/16/12 [phone]

## 2012-12-17 LAB — GC/CHLAMYDIA PROBE AMP: CT Probe RNA: NEGATIVE

## 2012-12-17 LAB — HIV ANTIBODY (ROUTINE TESTING W REFLEX): HIV: NONREACTIVE

## 2012-12-17 LAB — RPR: RPR Ser Ql: NONREACTIVE

## 2013-08-04 ENCOUNTER — Emergency Department (HOSPITAL_COMMUNITY)
Admission: EM | Admit: 2013-08-04 | Discharge: 2013-08-05 | Disposition: A | Discharge status: Home / Self Care | Payer: Medicaid Other | Attending: Emergency Medicine | Admitting: Emergency Medicine

## 2013-08-04 ENCOUNTER — Encounter (HOSPITAL_COMMUNITY): Payer: Self-pay | Admitting: Emergency Medicine

## 2013-08-04 DIAGNOSIS — Z8619 Personal history of other infectious and parasitic diseases: Secondary | ICD-10-CM | POA: Diagnosis not present

## 2013-08-04 DIAGNOSIS — K089 Disorder of teeth and supporting structures, unspecified: Secondary | ICD-10-CM | POA: Diagnosis not present

## 2013-08-04 DIAGNOSIS — M25569 Pain in unspecified knee: Secondary | ICD-10-CM | POA: Insufficient documentation

## 2013-08-04 DIAGNOSIS — K029 Dental caries, unspecified: Secondary | ICD-10-CM | POA: Diagnosis not present

## 2013-08-04 DIAGNOSIS — K0889 Other specified disorders of teeth and supporting structures: Secondary | ICD-10-CM

## 2013-08-04 NOTE — ED Provider Notes (Signed)
CSN: [id number]     Arrival date & time 08/04/13  2303 History  This chart was scribed for non-physician practitioner Marlon Pel working with Vida Roller, MD by Carl Best, ED Scribe. This patient was seen in room WTR9/WTR9 and the patient's care was started at 11:47 PM.     Chief Complaint  Patient presents with  . Dental Pain   Patient is a 23 y.o. female presenting with tooth pain. The history is provided by the patient. No language interpreter was used.  Dental Pain Associated symptoms: no drooling, no facial swelling and no fever    HPI Comments: Mahoganie Basher is a 23 y.o. female who presents to the Emergency Department complaining of constant upper left sided dental pain that started suddenly two days ago.  The patient states that she has taken Tylenol with no relief to her symptoms.  The patient denies fever and drooling as associated symptoms.  She states that she is able to eat and drink.  The patient states that she has a history of dental problems.    The patient is also complaining of constant, sharp bilateral knee pain.  She states that she works 2 jobs and is on her feet a lot.  She states that her shoes do not provide adequate support.   Past Medical History  Diagnosis Date  . Genital HSV   . History of chlamydia   . Late prenatal care   . Abnormal Pap smear    History reviewed. No pertinent past surgical history. Family History  Problem Relation Age of Onset  . Diabetes Maternal Grandmother   . Hypertension Maternal Grandmother   . Diabetes Maternal Grandfather   . Hypertension Maternal Grandfather    History  Substance Use Topics  . Smoking status: Never Smoker   . Smokeless tobacco: Not on file  . Alcohol Use: Yes     Comment: occ   OB History   Grav Para Term Preterm Abortions TAB SAB Ect Mult Living   3 3 3       3      Review of Systems  Constitutional: Negative for fever.  HENT: Positive for dental problem. Negative for drooling, facial  swelling and trouble swallowing.   Respiratory: Negative for shortness of breath.   All other systems reviewed and are negative.     Allergies  Review of patient's allergies indicates no known allergies.  Home Medications   Prior to Admission medications   Medication Sig Start Date End Date Taking? Authorizing Provider  amoxicillin (AMOXIL) 500 MG capsule Take 1 capsule (500 mg total) by mouth 3 (three) times daily. 08/05/13   Janal Haak Irine Seal, PA-C  promethazine (PHENERGAN) 25 MG tablet Take 1 tablet (25 mg total) by mouth every 6 (six) hours as needed for nausea or vomiting. 08/05/13   Dorthula Matas, PA-C  traMADol (ULTRAM) 50 MG tablet Take 1 tablet (50 mg total) by mouth every 6 (six) hours as needed. 08/05/13   Dorthula Matas, PA-C   Triage Vitals: BP 106/75  Pulse 58  Temp(Src) 98.2 F (36.8 C) (Oral)  Resp 16  Ht 5\' 2"  (1.575 m)  Wt 119 lb (53.978 kg)  BMI 21.76 kg/m2  SpO2 100%  LMP 07/19/2013  Physical Exam  Nursing note and vitals reviewed. Constitutional: She is oriented to person, place, and time. She appears well-developed and well-nourished.  HENT:  Head: Normocephalic and atraumatic.  Mouth/Throat: Uvula is midline and oropharynx is clear and moist. No trismus in  the jaw. Dental caries present. No dental abscesses.    Eyes: EOM are normal.  Neck: Normal range of motion.  Cardiovascular: Normal rate.   Pulmonary/Chest: Effort normal.  Musculoskeletal: Normal range of motion.  Neurological: She is alert and oriented to person, place, and time.  Skin: Skin is warm and dry.  Psychiatric: She has a normal mood and affect. Her behavior is normal.    ED Course  Procedures (including critical care time)  DIAGNOSTIC STUDIES: Oxygen Saturation is 100% on room air, normal by my interpretation.    COORDINATION OF CARE: 11:48 PM-  The patient declined a dental block.  Discussed discharging the patient with pain medication to treat a suspected cavity and a  referral to a dentist and orthopedist for a follow-up appointment.  The patient agreed to the treatment plan.   Labs Review Labs Reviewed - No data to display  Imaging Review No results found.   EKG Interpretation None      MDM   Final diagnoses:  Toothache    Patient has dental pain. No emergent s/sx's present. Patent airway. No trismus.  Will be given pain medication and antibiotics. I discussed the need to call dentist within 24/48 hours for follow-up. Dental referral given. Return to ED precautions given.  Pt voiced understanding and has agreed to follow-up.   23 y.o.Malachi BondsGloria Wenger's evaluation in the Emergency Department is complete. It has been determined that no acute conditions requiring further emergency intervention are present at this time. The patient/guardian have been advised of the diagnosis and plan. We have discussed signs and symptoms that warrant return to the ED, such as changes or worsening in symptoms.  Vital signs are stable at discharge. Filed Vitals:   08/04/13 2319  BP: 106/75  Pulse: 58  Temp: 98.2 F (36.8 C)  Resp: 16    Patient/guardian has voiced understanding and agreed to follow-up with the PCP or specialist.  I personally performed the services described in this documentation, which was scribed in my presence. The recorded information has been reviewed and is accurate.   Dorthula Matasiffany G Joyce Leckey, PA-C 08/05/13 0022

## 2013-08-04 NOTE — ED Notes (Signed)
Pt c/o L upper dental pain x 2 days, no relief with tylenol, pt states she has had an abscess in same area. Pt also c/o bilat knee pain, denies injury. No swelling noted.

## 2013-08-05 MED ORDER — AMOXICILLIN 500 MG PO CAPS: 500.0000 mg | ORAL_CAPSULE | Freq: Three times a day (TID) | ORAL | Status: DC

## 2013-08-05 MED ORDER — ONDANSETRON 4 MG PO TBDP
4.0000 mg | ORAL_TABLET | Freq: Once | ORAL | Status: AC
  Administered 2013-08-05: 4 mg via ORAL
  Filled 2013-08-05: qty 1

## 2013-08-05 MED ORDER — TRAMADOL HCL 50 MG PO TABS: 50.0000 mg | ORAL_TABLET | Freq: Four times a day (QID) | ORAL | Status: DC | PRN

## 2013-08-05 MED ORDER — HYDROCODONE-ACETAMINOPHEN 5-325 MG PO TABS
2.0000 | ORAL_TABLET | Freq: Once | ORAL | Status: AC
  Administered 2013-08-05: 2 via ORAL
  Filled 2013-08-05: qty 2

## 2013-08-05 MED ORDER — PROMETHAZINE HCL 25 MG PO TABS: 25.0000 mg | ORAL_TABLET | Freq: Four times a day (QID) | ORAL | Status: DC | PRN

## 2013-08-05 NOTE — ED Provider Notes (Signed)
Medical screening examination/treatment/procedure(s) were performed by non-physician practitioner and as supervising physician I was immediately available for consultation/collaboration.    Sally-Ann Cutbirth D Zalman Hull, MD 08/05/13 0305 

## 2013-08-05 NOTE — Discharge Instructions (Signed)
Dental Pain °A tooth ache may be caused by cavities (tooth decay). Cavities expose the nerve of the tooth to air and hot or cold temperatures. It may come from an infection or abscess (also called a boil or furuncle) around your tooth. It is also often caused by dental caries (tooth decay). This causes the pain you are having. °DIAGNOSIS  °Your caregiver can diagnose this problem by exam. °TREATMENT  °· If caused by an infection, it may be treated with medications which kill germs (antibiotics) and pain medications as prescribed by your caregiver. Take medications as directed. °· Only take over-the-counter or prescription medicines for pain, discomfort, or fever as directed by your caregiver. °· Whether the tooth ache today is caused by infection or dental disease, you should see your dentist as soon as possible for further care. °SEEK MEDICAL CARE IF: °The exam and treatment you received today has been provided on an emergency basis only. This is not a substitute for complete medical or dental care. If your problem worsens or new problems (symptoms) appear, and you are unable to meet with your dentist, call or return to this location. °SEEK IMMEDIATE MEDICAL CARE IF:  °· You have a fever. °· You develop redness and swelling of your face, jaw, or neck. °· You are unable to open your mouth. °· You have severe pain uncontrolled by pain medicine. °MAKE SURE YOU:  °· Understand these instructions. °· Will watch your condition. °· Will get help right away if you are not doing well or get worse. °Document Released: 01/05/2005 Document Revised: 03/30/2011 Document Reviewed: 08/24/2007 °ExitCare® Patient Information ©2015 [employer]. This information is not intended to replace advice given to you by your health care provider. Make sure you discuss any questions you have with your health care provider. ° °Dental Care and Dentist Visits °Dental care supports good overall health. Regular dental visits can also help you  avoid dental pain, bleeding, infection, and other more serious health problems in the future. It is important to keep the mouth healthy because diseases in the teeth, gums, and other oral tissues can spread to other areas of the body. Some problems, such as diabetes, heart disease, and pre-term labor have been associated with poor oral health.  °See your dentist every 6 months. If you experience emergency problems such as a toothache or broken tooth, go to the dentist right away. If you see your dentist regularly, you may catch problems early. It is easier to be treated for problems in the early stages.  °WHAT TO EXPECT AT A DENTIST VISIT  °Your dentist will look for many common oral health problems and recommend proper treatment. At your regular dental visit, you can expect: °· Gentle cleaning of the teeth and gums. This includes scraping and polishing. This helps to remove the sticky substance around the teeth and gums (plaque). Plaque forms in the mouth shortly after eating. Over time, plaque hardens on the teeth as tartar. If tartar is not removed regularly, it can cause problems. Cleaning also helps remove stains. °· Periodic X-rays. These pictures of the teeth and supporting bone will help your dentist assess the health of your teeth. °· Periodic fluoride treatments. Fluoride is a natural mineral shown to help strengthen teeth. Fluoride treatment involves applying a fluoride gel or varnish to the teeth. It is most commonly done in children. °· Examination of the mouth, tongue, jaws, teeth, and gums to look for any oral health problems, such as: °¨ Cavities (dental caries). This is   decay on the tooth caused by plaque, sugar, and acid in the mouth. It is best to catch a cavity when it is small. °¨ Inflammation of the gums caused by plaque buildup (gingivitis). °¨ Problems with the mouth or malformed or misaligned teeth. °¨ Oral cancer or other diseases of the soft tissues or jaws.  °KEEP YOUR TEETH AND GUMS  HEALTHY °For healthy teeth and gums, follow these general guidelines as well as your dentist's specific advice: °· Have your teeth professionally cleaned at the dentist every 6 months. °· Brush twice daily with a fluoride toothpaste. °· Floss your teeth daily.  °· Ask your dentist if you need fluoride supplements, treatments, or fluoride toothpaste. °· Eat a healthy diet. Reduce foods and drinks with added sugar. °· Avoid smoking. °TREATMENT FOR ORAL HEALTH PROBLEMS °If you have oral health problems, treatment varies depending on the conditions present in your teeth and gums. °· Your caregiver will most likely recommend good oral hygiene at each visit. °· For cavities, gingivitis, or other oral health disease, your caregiver will perform a procedure to treat the problem. This is typically done at a separate appointment. Sometimes your caregiver will refer you to another dental specialist for specific tooth problems or for surgery. °SEEK IMMEDIATE DENTAL CARE IF: °· You have pain, bleeding, or soreness in the gum, tooth, jaw, or mouth area. °· A permanent tooth becomes loose or separated from the gum socket. °· You experience a blow or injury to the mouth or jaw area. °Document Released: 09/17/2010 Document Revised: 03/30/2011 Document Reviewed: 09/17/2010 °ExitCare® Patient Information ©2015 [employer]. This information is not intended to replace advice given to you by your health care provider. Make sure you discuss any questions you have with your health care provider. ° °

## 2013-08-05 NOTE — ED Notes (Signed)
Pt states she has a ride home. 

## 2013-08-11 ENCOUNTER — Encounter (HOSPITAL_COMMUNITY): Payer: Self-pay | Admitting: Emergency Medicine

## 2013-08-11 ENCOUNTER — Emergency Department (HOSPITAL_COMMUNITY)
Admission: EM | Admit: 2013-08-11 | Discharge: 2013-08-11 | Disposition: A | Discharge status: Home / Self Care | Payer: Medicaid Other | Attending: Emergency Medicine | Admitting: Emergency Medicine

## 2013-08-11 DIAGNOSIS — F17200 Nicotine dependence, unspecified, uncomplicated: Secondary | ICD-10-CM | POA: Diagnosis not present

## 2013-08-11 DIAGNOSIS — Z8619 Personal history of other infectious and parasitic diseases: Secondary | ICD-10-CM | POA: Diagnosis not present

## 2013-08-11 DIAGNOSIS — Z79899 Other long term (current) drug therapy: Secondary | ICD-10-CM | POA: Diagnosis not present

## 2013-08-11 DIAGNOSIS — N760 Acute vaginitis: Secondary | ICD-10-CM

## 2013-08-11 DIAGNOSIS — Z792 Long term (current) use of antibiotics: Secondary | ICD-10-CM | POA: Diagnosis not present

## 2013-08-11 DIAGNOSIS — Z3202 Encounter for pregnancy test, result negative: Secondary | ICD-10-CM | POA: Diagnosis not present

## 2013-08-11 DIAGNOSIS — M549 Dorsalgia, unspecified: Secondary | ICD-10-CM | POA: Diagnosis present

## 2013-08-11 LAB — WET PREP, GENITAL: Trich, Wet Prep: NONE SEEN

## 2013-08-11 LAB — URINALYSIS, ROUTINE W REFLEX MICROSCOPIC
Bilirubin Urine: NEGATIVE
Glucose, UA: NEGATIVE mg/dL
Hgb urine dipstick: NEGATIVE
Ketones, ur: NEGATIVE mg/dL
NITRITE: NEGATIVE
PH: 6.5 (ref 5.0–8.0)
Protein, ur: NEGATIVE mg/dL
Specific Gravity, Urine: 1.016 (ref 1.005–1.030)
Urobilinogen, UA: 1 mg/dL (ref 0.0–1.0)

## 2013-08-11 LAB — HIV ANTIBODY (ROUTINE TESTING W REFLEX): HIV 1&2 Ab, 4th Generation: NONREACTIVE

## 2013-08-11 LAB — URINE MICROSCOPIC-ADD ON

## 2013-08-11 LAB — POC URINE PREG, ED: Preg Test, Ur: NEGATIVE

## 2013-08-11 MED ORDER — LIDOCAINE HCL (PF) 1 % IJ SOLN
INTRAMUSCULAR | Status: AC
  Filled 2013-08-11: qty 5

## 2013-08-11 MED ORDER — AZITHROMYCIN 250 MG PO TABS
1000.0000 mg | ORAL_TABLET | Freq: Once | ORAL | Status: AC
  Administered 2013-08-11: 1000 mg via ORAL
  Filled 2013-08-11: qty 4

## 2013-08-11 MED ORDER — IBUPROFEN 600 MG PO TABS: 600.0000 mg | ORAL_TABLET | Freq: Four times a day (QID) | ORAL | Status: DC | PRN

## 2013-08-11 MED ORDER — LIDOCAINE HCL (PF) 1 % IJ SOLN
0.9000 mL | Freq: Once | INTRAMUSCULAR | Status: AC
  Administered 2013-08-11: 0.9 mL

## 2013-08-11 MED ORDER — FLUCONAZOLE 200 MG PO TABS: 200.0000 mg | ORAL_TABLET | Freq: Every day | ORAL | Status: AC

## 2013-08-11 MED ORDER — ONDANSETRON 4 MG PO TBDP
4.0000 mg | ORAL_TABLET | Freq: Once | ORAL | Status: AC
  Administered 2013-08-11: 4 mg via ORAL
  Filled 2013-08-11: qty 1

## 2013-08-11 MED ORDER — CEFTRIAXONE SODIUM 250 MG IJ SOLR
250.0000 mg | Freq: Once | INTRAMUSCULAR | Status: AC
  Administered 2013-08-11: 250 mg via INTRAMUSCULAR
  Filled 2013-08-11: qty 250

## 2013-08-11 MED ORDER — HYDROCODONE-ACETAMINOPHEN 5-325 MG PO TABS
1.0000 | ORAL_TABLET | Freq: Once | ORAL | Status: AC
  Administered 2013-08-11: 1 via ORAL
  Filled 2013-08-11: qty 1

## 2013-08-11 NOTE — Discharge Instructions (Signed)
Bacterial Vaginosis Bacterial vaginosis is a vaginal infection that occurs when the normal balance of bacteria in the vagina is disrupted. It results from an overgrowth of certain bacteria. This is the most common vaginal infection in women of childbearing age. Treatment is important to prevent complications, especially in pregnant women, as it can cause a premature delivery. CAUSES  Bacterial vaginosis is caused by an increase in harmful bacteria that are normally present in smaller amounts in the vagina. Several different kinds of bacteria can cause bacterial vaginosis. However, the reason that the condition develops is not fully understood. RISK FACTORS Certain activities or behaviors can put you at an increased risk of developing bacterial vaginosis, including:  Having a new sex partner or multiple sex partners.  Douching.  Using an intrauterine device (IUD) for contraception. Women do not get bacterial vaginosis from toilet seats, bedding, swimming pools, or contact with objects around them. SIGNS AND SYMPTOMS  Some women with bacterial vaginosis have no signs or symptoms. Common symptoms include:  Grey vaginal discharge.  A fishlike odor with discharge, especially after sexual intercourse.  Itching or burning of the vagina and vulva.  Burning or pain with urination. DIAGNOSIS  Your health care provider will take a medical history and examine the vagina for signs of bacterial vaginosis. A sample of vaginal fluid may be taken. Your health care provider will look at this sample under a microscope to check for bacteria and abnormal cells. A vaginal pH test may also be done.  TREATMENT  Bacterial vaginosis may be treated with antibiotic medicines. These may be given in the form of a pill or a vaginal cream. A second round of antibiotics may be prescribed if the condition comes back after treatment.  HOME CARE INSTRUCTIONS   Only take over-the-counter or prescription medicines as  directed by your health care provider.  If antibiotic medicine was prescribed, take it as directed. Make sure you finish it even if you start to feel better.  Do not have sex until treatment is completed.  Tell all sexual partners that you have a vaginal infection. They should see their health care provider and be treated if they have problems, such as a mild rash or itching.  Practice safe sex by using condoms and only having one sex partner. SEEK MEDICAL CARE IF:   Your symptoms are not improving after 3 days of treatment.  You have increased discharge or pain.  You have a fever. MAKE SURE YOU:   Understand these instructions.  Will watch your condition.  Will get help right away if you are not doing well or get worse. FOR MORE INFORMATION  Centers for Disease Control and Prevention, Division of STD Prevention: www.cdc.gov/std American Sexual Health Association (ASHA): www.ashastd.org  Document Released: 01/05/2005 Document Revised: 10/26/2012 Document Reviewed: 08/17/2012 ExitCare Patient Information 2015 [employer]. This information is not intended to replace advice given to you by your health care provider. Make sure you discuss any questions you have with your health care provider.  

## 2013-08-11 NOTE — ED Notes (Signed)
[  pt reports lower back pain and bilateral leg pain with no known injury. Pt also states that she has "vaginal irritation" and itching. Pt denies any vaginal discharge.

## 2013-08-11 NOTE — ED Provider Notes (Signed)
  Medical screening examination/treatment/procedure(s) were performed by non-physician practitioner and as supervising physician I was immediately available for consultation/collaboration.   EKG Interpretation None          Gerhard Munchobert Reginold Beale, MD 08/11/13 [phone]

## 2013-08-11 NOTE — ED Provider Notes (Signed)
CSN: [id number]     Arrival date & time 08/11/13  0715 History   First MD Initiated Contact with Patient 08/11/13 0719     Chief Complaint  Patient presents with  . Back Pain     (Consider location/radiation/quality/duration/timing/severity/associated sxs/prior Treatment) HPI Patient to the ER for evaluation of vaginal itching and suprapubic pain for two days.  She denies being sexually active or having discharge.  She denies fevers, nausea, vomiting, diarrhea, weakness, SOB,CP, no vaginal bleeding. The pain is mild and wraps around towards her low back.   Past Medical History  Diagnosis Date  . Genital HSV   . History of chlamydia   . Late prenatal care   . Abnormal Pap smear    History reviewed. No pertinent past surgical history. Family History  Problem Relation Age of Onset  . Diabetes Maternal Grandmother   . Hypertension Maternal Grandmother   . Diabetes Maternal Grandfather   . Hypertension Maternal Grandfather    History  Substance Use Topics  . Smoking status: Current Some Day Smoker  . Smokeless tobacco: Not on file  . Alcohol Use: Yes     Comment: occ   OB History   Grav Para Term Preterm Abortions TAB SAB Ect Mult Living   3 3 3       3      Review of Systems  All other systems reviewed and are negative.     Allergies  Review of patient's allergies indicates no known allergies.  Home Medications   Prior to Admission medications   Medication Sig Start Date End Date Taking? Authorizing Provider  amoxicillin (AMOXIL) 500 MG capsule Take 1 capsule (500 mg total) by mouth 3 (three) times daily. 08/05/13  Yes Tyara Dassow Irine SealG Alaena Strader, PA-C  promethazine (PHENERGAN) 25 MG tablet Take 1 tablet (25 mg total) by mouth every 6 (six) hours as needed for nausea or vomiting. 08/05/13  Yes Cordaryl Decelles Irine SealG Heloise Gordan, PA-C  traMADol (ULTRAM) 50 MG tablet Take 50 mg by mouth every 6 (six) hours as needed for moderate pain.   Yes Historical Provider, MD  fluconazole (DIFLUCAN) 200 MG  tablet Take 1 tablet (200 mg total) by mouth daily. 08/11/13 08/18/13  Charleston Hankin Irine SealG Katelee Schupp, PA-C   BP 102/75  Pulse 83  Temp(Src) 97.6 F (36.4 C) (Oral)  Resp 20  SpO2 98%  LMP 07/19/2013 Physical Exam  Nursing note and vitals reviewed. Constitutional: She appears well-developed and well-nourished. No distress.  HENT:  Head: Normocephalic and atraumatic.  Eyes: Pupils are equal, round, and reactive to light.  Neck: Normal range of motion. Neck supple.  Cardiovascular: Normal rate and regular rhythm.   Pulmonary/Chest: Effort normal.  Abdominal: Soft. There is tenderness in the suprapubic area. There is no guarding, no CVA tenderness, no tenderness at McBurney's point and negative Murphy's sign.  Genitourinary: Uterus normal. Cervix exhibits no motion tenderness. Right adnexum displays no tenderness. Left adnexum displays no tenderness. Vaginal discharge (white) found.  Neurological: She is alert.  Skin: Skin is warm and dry.    ED Course  Procedures (including critical care time) Labs Review Labs Reviewed  WET PREP, GENITAL - Abnormal; Notable for the following:    Yeast Wet Prep HPF POC FEW (*)    Clue Cells Wet Prep HPF POC FEW (*)    WBC, Wet Prep HPF POC MANY (*)    All other components within normal limits  URINALYSIS, ROUTINE W REFLEX MICROSCOPIC - Abnormal; Notable for the following:    Leukocytes, UA  LARGE (*)    All other components within normal limits  URINE MICROSCOPIC-ADD ON - Abnormal; Notable for the following:    Squamous Epithelial / LPF FEW (*)    Bacteria, UA FEW (*)    All other components within normal limits  GC/CHLAMYDIA PROBE AMP  URINE CULTURE  HIV ANTIBODY (ROUTINE TESTING)  POC URINE PREG, ED    Imaging Review No results found.   EKG Interpretation None      MDM   Final diagnoses:  Vaginosis    She presents to the emergency department with complaints of vaginal itching and some low back pain. He denies having any discharge or being  sexually active. Her wet prep shows that she's got many white blood cells few clue cells and some yeast cells. Her urinalysis shows that she's got a large amount leukocytes which I feel could be contamination. Urine cultures sent out.   Given in ED: Medications  cefTRIAXone (ROCEPHIN) injection 250 mg (not administered)  lidocaine (PF) (XYLOCAINE) 1 % injection 0.9 mL (not administered)  lidocaine (PF) (XYLOCAINE) 1 % injection (not administered)  HYDROcodone-acetaminophen (NORCO/VICODIN) 5-325 MG per tablet 1 tablet (not administered)  azithromycin (ZITHROMAX) tablet 1,000 mg (1,000 mg Oral Given 08/11/13 0843)  ondansetron (ZOFRAN-ODT) disintegrating tablet 4 mg (4 mg Oral Given 08/11/13 0843)    Rx:  fluconazole (DIFLUCAN) 200 MG tablet Take 1 tablet (200 mg total) by mouth daily. 7 tablet Dorthula Matas, PA-C  Discussed results with patient and advised that she would be called in for the next 2 days her culture returned positive.  23 y.o.Sonya Foster's evaluation in the Emergency Department is complete. It has been determined that no acute conditions requiring further emergency intervention are present at this time. The patient/guardian have been advised of the diagnosis and plan. We have discussed signs and symptoms that warrant return to the ED, such as changes or worsening in symptoms.  Vital signs are stable at discharge. Filed Vitals:   08/11/13 0726  BP: 102/75  Pulse: 83  Temp: 97.6 F (36.4 C)  Resp: 20    Patient/guardian has voiced understanding and agreed to follow-up with the PCP or specialist.    Dorthula Matas, PA-C 08/11/13 0844  Dorthula Matas, PA-C 08/11/13 1610

## 2013-08-11 NOTE — ED Notes (Signed)
Tiffany, PA at the bedside. Pelvic cart at bedside and pt set up.

## 2013-08-12 LAB — URINE CULTURE
COLONY COUNT: NO GROWTH
CULTURE: NO GROWTH
Special Requests: NORMAL

## 2013-08-12 LAB — GC/CHLAMYDIA PROBE AMP
CT Probe RNA: NEGATIVE
GC PROBE AMP APTIMA: NEGATIVE

## 2013-11-20 ENCOUNTER — Encounter (HOSPITAL_COMMUNITY): Payer: Self-pay | Admitting: Emergency Medicine

## 2013-11-24 ENCOUNTER — Emergency Department (HOSPITAL_COMMUNITY)
Admission: EM | Admit: 2013-11-24 | Discharge: 2013-11-24 | Disposition: A | Discharge status: Home / Self Care | Payer: Medicaid Other | Attending: Emergency Medicine | Admitting: Emergency Medicine

## 2013-11-24 ENCOUNTER — Encounter (HOSPITAL_COMMUNITY): Payer: Self-pay | Admitting: Emergency Medicine

## 2013-11-24 DIAGNOSIS — Z792 Long term (current) use of antibiotics: Secondary | ICD-10-CM | POA: Diagnosis not present

## 2013-11-24 DIAGNOSIS — J040 Acute laryngitis: Secondary | ICD-10-CM | POA: Insufficient documentation

## 2013-11-24 DIAGNOSIS — Z87891 Personal history of nicotine dependence: Secondary | ICD-10-CM | POA: Diagnosis not present

## 2013-11-24 DIAGNOSIS — R05 Cough: Secondary | ICD-10-CM | POA: Diagnosis present

## 2013-11-24 DIAGNOSIS — J029 Acute pharyngitis, unspecified: Secondary | ICD-10-CM

## 2013-11-24 DIAGNOSIS — Z8619 Personal history of other infectious and parasitic diseases: Secondary | ICD-10-CM | POA: Insufficient documentation

## 2013-11-24 DIAGNOSIS — R059 Cough, unspecified: Secondary | ICD-10-CM

## 2013-11-24 LAB — RAPID STREP SCREEN (MED CTR MEBANE ONLY): Streptococcus, Group A Screen (Direct): NEGATIVE

## 2013-11-24 MED ORDER — HYDROCODONE-ACETAMINOPHEN 7.5-325 MG/15ML PO SOLN: 15.0000 mL | ORAL | Status: DC | PRN

## 2013-11-24 NOTE — ED Provider Notes (Signed)
CSN: [id number]     Arrival date & time 11/24/13  1010 History  This chart was scribed for non-physician practitioner, Sharilyn SitesLisa Sanders, PA-C,working with Warnell Foresterrey Wofford, MD, by Karle PlumberJennifer Tensley, ED Scribe. This patient was seen in room TR05C/TR05C and the patient's care was started at 10:31 AM.  Chief Complaint  Patient presents with  . Laryngitis  . Sore Throat  . Cough   Patient is a 23 y.o. female presenting with pharyngitis and cough. The history is provided by the patient. No language interpreter was used.  Sore Throat Pertinent negatives include no chest pain and no shortness of breath.  Cough Associated symptoms: sore throat   Associated symptoms: no chest pain, no chills, no fever and no shortness of breath     HPI Comments:  Sonya Foster is a 23 y.o. female who presents to the Emergency Department complaining of productive cough of phlegm that began five days ago. She reports associated sore throat and change in voice. She reports one episode of post tussive emesis and chest tightness secondary to cough. She has not done anything to treat her symptoms. Denies modifying factors. She is unsure of any sick contacts. Denies CP, SOB, fever, chills, nausea, vomiting or diarrhea currently. No h/o strep throat.  Past Medical History  Diagnosis Date  . Genital HSV   . History of chlamydia   . Late prenatal care   . Abnormal Pap smear    History reviewed. No pertinent past surgical history. Family History  Problem Relation Age of Onset  . Diabetes Maternal Grandmother   . Hypertension Maternal Grandmother   . Diabetes Maternal Grandfather   . Hypertension Maternal Grandfather    History  Substance Use Topics  . Smoking status: Former Smoker    Quit date: 06/24/2013  . Smokeless tobacco: Not on file  . Alcohol Use: Yes     Comment: occ   OB History    Gravida Para Term Preterm AB TAB SAB Ectopic Multiple Living   3 3 3       3      Review of Systems  Constitutional: Negative  for fever and chills.  HENT: Positive for sore throat and voice change.   Respiratory: Positive for cough. Negative for shortness of breath.   Cardiovascular: Negative for chest pain.  Gastrointestinal: Negative for nausea, vomiting and diarrhea.    Allergies  Review of patient's allergies indicates no known allergies.  Home Medications   Prior to Admission medications   Medication Sig Start Date End Date Taking? Authorizing Provider  amoxicillin (AMOXIL) 500 MG capsule Take 1 capsule (500 mg total) by mouth 3 (three) times daily. 08/05/13   Tiffany Irine SealG Greene, PA-C  ibuprofen (ADVIL,MOTRIN) 600 MG tablet Take 1 tablet (600 mg total) by mouth every 6 (six) hours as needed. 08/11/13   Tiffany Irine SealG Greene, PA-C  promethazine (PHENERGAN) 25 MG tablet Take 1 tablet (25 mg total) by mouth every 6 (six) hours as needed for nausea or vomiting. 08/05/13   Dorthula Matasiffany G Greene, PA-C  traMADol (ULTRAM) 50 MG tablet Take 50 mg by mouth every 6 (six) hours as needed for moderate pain.    Historical Provider, MD   Triage Vitals: BP 115/83 mmHg  Pulse 74  Temp(Src) 97.7 F (36.5 C) (Oral)  Resp 18  Ht 5\' 2"  (1.575 m)  Wt 120 lb (54.432 kg)  BMI 21.94 kg/m2  SpO2 100%  Physical Exam  Constitutional: She is oriented to person, place, and time. She appears well-developed and  well-nourished.  HENT:  Head: Normocephalic and atraumatic.  Right Ear: Tympanic membrane and ear canal normal.  Left Ear: Tympanic membrane and ear canal normal.  Nose: Nose normal.  Mouth/Throat: Uvula is midline, oropharynx is clear and moist and mucous membranes are normal. No oropharyngeal exudate, posterior oropharyngeal edema, posterior oropharyngeal erythema or tonsillar abscesses.  Tonsils normal in appearance bilaterally without exudate; uvula midline without peritonsillar abscess; handling secretions appropriately; no difficulty swallowing; voice is hoarse  Eyes: Conjunctivae and EOM are normal. Pupils are equal, round, and  reactive to light.  Neck: Trachea normal and normal range of motion. No tracheal tenderness present. No tracheal deviation present.  Trachea midline, no stridor  Cardiovascular: Normal rate, regular rhythm and normal heart sounds.  Exam reveals no gallop and no friction rub.   No murmur heard. Pulmonary/Chest: Effort normal and breath sounds normal. No stridor. No respiratory distress. She has no wheezes. She has no rhonchi. She has no rales.  Abdominal: Soft. Bowel sounds are normal.  Musculoskeletal: Normal range of motion.  Neurological: She is alert and oriented to person, place, and time.  Skin: Skin is warm and dry.  Psychiatric: She has a normal mood and affect.  Nursing note and vitals reviewed.   ED Course  Procedures (including critical care time) DIAGNOSTIC STUDIES: Oxygen Saturation is 100% on RA, normal by my interpretation.   COORDINATION OF CARE: 10:35 AM- Will check rapid strep test. Pt verbalizes understanding and agrees to plan.  Medications - No data to display  Labs Review Labs Reviewed - No data to display  Imaging Review No results found.   EKG Interpretation None      MDM   Final diagnoses:  Laryngitis  Sore throat  Cough   Sore throat, hoarse voice, and productive cough x 5 days.  On exam, patient afebrile and non-toxic in appearance.  Oropharynx is clear, no tonsillar edema or abscess, handling secretions well.  Lungs clear bilaterally without wheezes or rhonchi to suggest CAP.  Rapid strep negative, culture pending.  Constellation of sx likely viral in nature.  Patient instructed on supportive care.  Rx hycet.  Discussed plan with patient, he/she acknowledged understanding and agreed with plan of care.  Return precautions given for new or worsening symptoms.  I personally performed the services described in this documentation, which was scribed in my presence. The recorded information has been reviewed and is accurate.  Garlon HatchetLisa M Sanders,  PA-C 11/24/13 1200  Warnell Foresterrey Wofford, MD 11/24/13 [phone]

## 2013-11-24 NOTE — ED Notes (Signed)
Patient states she started getting sick on Sunday.  Patient complains of sore throat, laryngitis, and cough.    Patient states cough is productive.

## 2013-11-24 NOTE — Discharge Instructions (Signed)
Take the prescribed medication as directed. °Follow-up with your primary care physician. °Return to the ED for new or worsening symptoms. ° °

## 2013-11-27 LAB — CULTURE, GROUP A STREP

## 2014-05-16 ENCOUNTER — Emergency Department (HOSPITAL_COMMUNITY)
Admission: EM | Admit: 2014-05-16 | Discharge: 2014-05-16 | Discharge status: Against Medical Advice | Payer: Medicaid Other | Attending: Emergency Medicine | Admitting: Emergency Medicine

## 2014-05-16 ENCOUNTER — Encounter (HOSPITAL_COMMUNITY): Payer: Self-pay | Admitting: Emergency Medicine

## 2014-05-16 DIAGNOSIS — R51 Headache: Secondary | ICD-10-CM | POA: Diagnosis present

## 2014-05-16 NOTE — ED Notes (Signed)
Unable to locate when called for room 

## 2014-05-16 NOTE — ED Notes (Signed)
C/o migraine since this afternoon.  Reports history of same.  Also reports nausea and seeing floaters.  No neuro deficits noted on triage exam.

## 2014-06-03 ENCOUNTER — Encounter (HOSPITAL_COMMUNITY): Payer: Self-pay | Admitting: Emergency Medicine

## 2014-06-03 ENCOUNTER — Emergency Department (HOSPITAL_COMMUNITY)
Admission: EM | Admit: 2014-06-03 | Discharge: 2014-06-03 | Disposition: A | Discharge status: Home / Self Care | Payer: Medicaid Other | Attending: Emergency Medicine | Admitting: Emergency Medicine

## 2014-06-03 DIAGNOSIS — Z87891 Personal history of nicotine dependence: Secondary | ICD-10-CM | POA: Insufficient documentation

## 2014-06-03 DIAGNOSIS — R061 Stridor: Secondary | ICD-10-CM | POA: Insufficient documentation

## 2014-06-03 DIAGNOSIS — L509 Urticaria, unspecified: Secondary | ICD-10-CM

## 2014-06-03 DIAGNOSIS — L500 Allergic urticaria: Secondary | ICD-10-CM | POA: Insufficient documentation

## 2014-06-03 DIAGNOSIS — Z8619 Personal history of other infectious and parasitic diseases: Secondary | ICD-10-CM | POA: Insufficient documentation

## 2014-06-03 DIAGNOSIS — R21 Rash and other nonspecific skin eruption: Secondary | ICD-10-CM | POA: Diagnosis present

## 2014-06-03 MED ORDER — METHYLPREDNISOLONE SODIUM SUCC 125 MG IJ SOLR
125.0000 mg | Freq: Once | INTRAMUSCULAR | Status: AC
  Administered 2014-06-03: 125 mg via INTRAVENOUS
  Filled 2014-06-03: qty 2

## 2014-06-03 MED ORDER — PREDNISONE 10 MG PO TABS: ORAL_TABLET | ORAL | Status: DC

## 2014-06-03 MED ORDER — LORATADINE 10 MG PO TABS
10.0000 mg | ORAL_TABLET | Freq: Once | ORAL | Status: AC
  Administered 2014-06-03: 10 mg via ORAL
  Filled 2014-06-03: qty 1

## 2014-06-03 MED ORDER — FAMOTIDINE IN NACL 20-0.9 MG/50ML-% IV SOLN
20.0000 mg | Freq: Once | INTRAVENOUS | Status: AC
  Administered 2014-06-03: 20 mg via INTRAVENOUS
  Filled 2014-06-03: qty 50

## 2014-06-03 MED ORDER — FAMOTIDINE 20 MG PO TABS: 20.0000 mg | ORAL_TABLET | Freq: Two times a day (BID) | ORAL | Status: DC

## 2014-06-03 MED ORDER — LORATADINE 10 MG PO TABS: 10.0000 mg | ORAL_TABLET | Freq: Every day | ORAL | Status: DC

## 2014-06-03 NOTE — ED Provider Notes (Signed)
CSN: [id number]     Arrival date & time 06/03/14  0111 History   First MD Initiated Contact with Patient 06/03/14 0224     Chief Complaint  Patient presents with  . Allergic Reaction     (Consider location/radiation/quality/duration/timing/severity/associated sxs/prior Treatment) HPI Comments: Diffuse rash starting 2 days ago that is intermittent, significantly pruritic. She has had similar symptoms in the past. No SOB, difficulty swallowing. No new household products, foods or known allergens.   Patient is a 24 y.o. female presenting with allergic reaction. The history is provided by the patient. No language interpreter was used.  Allergic Reaction Presenting symptoms: itching and rash   Presenting symptoms: no difficulty breathing and no difficulty swallowing   Severity:  Moderate   Past Medical History  Diagnosis Date  . Genital HSV   . History of chlamydia   . Late prenatal care   . Abnormal Pap smear    History reviewed. No pertinent past surgical history. Family History  Problem Relation Age of Onset  . Diabetes Maternal Grandmother   . Hypertension Maternal Grandmother   . Diabetes Maternal Grandfather   . Hypertension Maternal Grandfather    History  Substance Use Topics  . Smoking status: Former Smoker    Quit date: 06/24/2013  . Smokeless tobacco: Not on file  . Alcohol Use: Yes     Comment: occ   OB History    Gravida Para Term Preterm AB TAB SAB Ectopic Multiple Living   3 3 3       3      Review of Systems  Constitutional: Negative for fever and chills.  HENT: Negative.  Negative for trouble swallowing.   Respiratory: Negative.  Negative for shortness of breath.   Cardiovascular: Negative.   Gastrointestinal: Negative.  Negative for nausea and abdominal pain.  Musculoskeletal: Negative.   Skin: Positive for itching and rash.  Neurological: Negative.  Negative for syncope and weakness.      Allergies  Review of patient's allergies indicates no  known allergies.  Home Medications   Prior to Admission medications   Medication Sig Start Date End Date Taking? Authorizing Provider  diphenhydrAMINE (BENADRYL) 25 mg capsule Take 25 mg by mouth every 6 (six) hours as needed.   Yes Historical Provider, MD  hydrocortisone cream 1 % Apply 1 application topically 3 (three) times daily as needed for itching.   Yes Historical Provider, MD  amoxicillin (AMOXIL) 500 MG capsule Take 1 capsule (500 mg total) by mouth 3 (three) times daily. Patient not taking: Reported on 11/24/2013 08/05/13   Marlon Peliffany Greene, PA-C  HYDROcodone-acetaminophen (HYCET) 7.5-325 mg/15 ml solution Take 15 mLs by mouth every 4 (four) hours as needed for moderate pain or severe pain. Patient not taking: Reported on 05/16/2014 11/24/13   Garlon HatchetLisa M Sanders, PA-C  ibuprofen (ADVIL,MOTRIN) 600 MG tablet Take 1 tablet (600 mg total) by mouth every 6 (six) hours as needed. Patient not taking: Reported on 11/24/2013 08/11/13   Marlon Peliffany Greene, PA-C  promethazine (PHENERGAN) 25 MG tablet Take 1 tablet (25 mg total) by mouth every 6 (six) hours as needed for nausea or vomiting. Patient not taking: Reported on 11/24/2013 08/05/13   Marlon Peliffany Greene, PA-C   BP 128/75 mmHg  Pulse 78  Temp(Src) 98.5 F (36.9 C) (Oral)  Resp 18  SpO2 100%  LMP 05/27/2014 Physical Exam  Constitutional: She is oriented to person, place, and time. She appears well-developed and well-nourished.  HENT:  Head: Normocephalic.  Mouth/Throat: Oropharynx is clear  and moist.  Neck: Normal range of motion. Neck supple.  Cardiovascular: Normal rate and regular rhythm.   Pulmonary/Chest: Effort normal and breath sounds normal. Stridor present. She has no wheezes. She has no rales.  Musculoskeletal: Normal range of motion. She exhibits no edema.  Neurological: She is alert and oriented to person, place, and time.  Skin: Skin is warm and dry. No rash noted.  Red, raised welts c/w hives in generalized distribution.   Psychiatric: She has a normal mood and affect.    ED Course  Procedures (including critical care time) Labs Review Labs Reviewed - No data to display  Imaging Review No results found.   EKG Interpretation None      MDM   Final diagnoses:  None    1. Hives  Uncomplicated hives treatable with antihistamines, prednisone, Pepcid. Will refer to allergist as this is recurrence.    Elpidio AnisShari Samayra Hebel, PA-C 06/03/14 [id number]  Marisa Severinlga Otter, MD 06/03/14 [phone]

## 2014-06-03 NOTE — ED Notes (Signed)
Pt alert, oriented, and ambulatory upon DC. [facility]

## 2014-06-03 NOTE — Discharge Instructions (Signed)
FOLLOW UP WITH [facility] OR WITH A CHOSEN PROVIDER ON THE RESOURCE LIST PROVIDED IN THESE INSTRUCTIONS.   Hives Hives are itchy, red, swollen areas of the skin. They can vary in size and location on your body. Hives can come and go for hours or several days (acute hives) or for several weeks (chronic hives). Hives do not spread from person to person (noncontagious). They may get worse with scratching, exercise, and emotional stress. CAUSES   Allergic reaction to food, additives, or drugs.  Infections, including the common cold.  Illness, such as vasculitis, lupus, or thyroid disease.  Exposure to sunlight, heat, or cold.  Exercise.  Stress.  Contact with chemicals. SYMPTOMS   Red or white swollen patches on the skin. The patches may change size, shape, and location quickly and repeatedly.  Itching.  Swelling of the hands, feet, and face. This may occur if hives develop deeper in the skin. DIAGNOSIS  Your caregiver can usually tell what is wrong by performing a physical exam. Skin or blood tests may also be done to determine the cause of your hives. In some cases, the cause cannot be determined. TREATMENT  Mild cases usually get better with medicines such as antihistamines. Severe cases may require an emergency epinephrine injection. If the cause of your hives is known, treatment includes avoiding that trigger.  HOME CARE INSTRUCTIONS   Avoid causes that trigger your hives.  Take antihistamines as directed by your caregiver to reduce the severity of your hives. Non-sedating or low-sedating antihistamines are usually recommended. Do not drive while taking an antihistamine.  Take any other medicines prescribed for itching as directed by your caregiver.  Wear loose-fitting clothing.  Keep all follow-up appointments as directed by your caregiver. SEEK MEDICAL CARE IF:   You have persistent or severe itching that is not relieved with medicine.  You have painful or  swollen joints. SEEK IMMEDIATE MEDICAL CARE IF:   You have a fever.  Your tongue or lips are swollen.  You have trouble breathing or swallowing.  You feel tightness in the throat or chest.  You have abdominal pain. These problems may be the first sign of a life-threatening allergic reaction. Call your local emergency services (911 in [place] MAKE SURE YOU:   Understand these instructions.  Will watch your condition.  Will get help right away if you are not doing well or get worse. Document Released: 01/05/2005 Document Revised: 01/10/2013 Document Reviewed: 03/31/2011 [facility] Patient Information 2015 [employer] [place]. This information is not intended to replace advice given to you by your health care provider. Make sure you discuss any questions you have with your health care provider.

## 2014-06-03 NOTE — ED Notes (Addendum)
Pt presents with hives to arms/legs and torso x 2 days. Pt states she had this happen in 2014 and it resolved, unknown allergen. Pt states she has a funny feeling in her throat, denies swelling or tightness

## 2014-12-09 ENCOUNTER — Encounter (HOSPITAL_COMMUNITY): Payer: Self-pay | Admitting: Emergency Medicine

## 2014-12-09 ENCOUNTER — Emergency Department (HOSPITAL_COMMUNITY)
Admission: EM | Admit: 2014-12-09 | Discharge: 2014-12-09 | Disposition: A | Discharge status: Home / Self Care | Payer: Medicaid Other | Attending: Emergency Medicine | Admitting: Emergency Medicine

## 2014-12-09 DIAGNOSIS — Z3202 Encounter for pregnancy test, result negative: Secondary | ICD-10-CM | POA: Diagnosis not present

## 2014-12-09 DIAGNOSIS — Z87891 Personal history of nicotine dependence: Secondary | ICD-10-CM | POA: Insufficient documentation

## 2014-12-09 DIAGNOSIS — Z8619 Personal history of other infectious and parasitic diseases: Secondary | ICD-10-CM | POA: Diagnosis not present

## 2014-12-09 DIAGNOSIS — R531 Weakness: Secondary | ICD-10-CM | POA: Insufficient documentation

## 2014-12-09 DIAGNOSIS — R109 Unspecified abdominal pain: Secondary | ICD-10-CM | POA: Diagnosis not present

## 2014-12-09 DIAGNOSIS — K0889 Other specified disorders of teeth and supporting structures: Secondary | ICD-10-CM | POA: Diagnosis present

## 2014-12-09 DIAGNOSIS — R631 Polydipsia: Secondary | ICD-10-CM | POA: Insufficient documentation

## 2014-12-09 DIAGNOSIS — R350 Frequency of micturition: Secondary | ICD-10-CM | POA: Insufficient documentation

## 2014-12-09 LAB — URINALYSIS, ROUTINE W REFLEX MICROSCOPIC
Bilirubin Urine: NEGATIVE
Glucose, UA: NEGATIVE mg/dL
Hgb urine dipstick: NEGATIVE
KETONES UR: NEGATIVE mg/dL
LEUKOCYTES UA: NEGATIVE
NITRITE: NEGATIVE
PH: 5.5 (ref 5.0–8.0)
PROTEIN: NEGATIVE mg/dL
Specific Gravity, Urine: 1.015 (ref 1.005–1.030)

## 2014-12-09 LAB — PREGNANCY, URINE: Preg Test, Ur: NEGATIVE

## 2014-12-09 MED ORDER — IBUPROFEN 600 MG PO TABS: 600.0000 mg | ORAL_TABLET | Freq: Four times a day (QID) | ORAL | Status: DC | PRN

## 2014-12-09 MED ORDER — AMOXICILLIN 500 MG PO CAPS: 500.0000 mg | ORAL_CAPSULE | Freq: Three times a day (TID) | ORAL | Status: DC

## 2014-12-09 MED ORDER — IBUPROFEN 800 MG PO TABS
800.0000 mg | ORAL_TABLET | Freq: Once | ORAL | Status: AC
  Administered 2014-12-09: 800 mg via ORAL
  Filled 2014-12-09: qty 1

## 2014-12-09 MED ORDER — HYDROCODONE-ACETAMINOPHEN 5-325 MG PO TABS: 1.0000 | ORAL_TABLET | Freq: Four times a day (QID) | ORAL | Status: DC | PRN

## 2014-12-09 NOTE — ED Notes (Addendum)
Pt from home c/o upper and lower left sided dental pain x 3 days and intermittent abdominal pain x 1 week with urinary frequency x 3 weeks. She denies dysuria.  She denies abdominal pain currently.

## 2014-12-09 NOTE — ED Notes (Signed)
Urine sent to lab and they will collect pregnancy urine.  Order was sent down.

## 2014-12-09 NOTE — Discharge Instructions (Signed)
Please call and follow up with a dentist tomorrow for further management of your dental pain.  Take antibiotic and pain medication as prescribed.  Your urine did not show any signs of infection. You are currently not pregnant.  Dental Pain Dental pain may be caused by many things, including:  Tooth decay (cavities or caries). Cavities expose the nerve of your tooth to air and hot or cold temperatures. This can cause pain or discomfort.  Abscess or infection. A dental abscess is a collection of infected pus from a bacterial infection in the inner part of the tooth (pulp). It usually occurs at the end of the tooth's root.  Injury.  An unknown reason (idiopathic). Your pain may be mild or severe. It may only occur when:  You are chewing.  You are exposed to hot or cold temperature.  You are eating or drinking sugary foods or beverages, such as soda or candy. Your pain may also be constant. HOME CARE INSTRUCTIONS Watch your dental pain for any changes. The following actions may help to lessen any discomfort that you are feeling:  Take medicines only as directed by your dentist.  If you were prescribed an antibiotic medicine, finish all of it even if you start to feel better.  Keep all follow-up visits as directed by your dentist. This is important.  Do not apply heat to the outside of your face.  Rinse your mouth or gargle with salt water if directed by your dentist. This helps with pain and swelling.  You can make salt water by adding  tsp of salt to 1 cup of warm water.  Apply ice to the painful area of your face:  Put ice in a plastic bag.  Place a towel between your skin and the bag.  Leave the ice on for 20 minutes, 2-3 times per day.  Avoid foods or drinks that cause you pain, such as:  Very hot or very cold foods or drinks.  Sweet or sugary foods or drinks. SEEK MEDICAL CARE IF:  Your pain is not controlled with medicines.  Your symptoms are worse.  You have  new symptoms. SEEK IMMEDIATE MEDICAL CARE IF:  You are unable to open your mouth.  You are having trouble breathing or swallowing.  You have a fever.  Your face, neck, or jaw is swollen.   This information is not intended to replace advice given to you by your health care provider. Make sure you discuss any questions you have with your health care provider.   Document Released: 01/05/2005 Document Revised: 05/22/2014 Document Reviewed: 01/01/2014 Elsevier Interactive Patient Education [employer]2016 [employer] Inc.

## 2014-12-09 NOTE — ED Notes (Signed)
Pt attempting to provide a urine sample.

## 2014-12-09 NOTE — ED Provider Notes (Signed)
CSN: [id number]     Arrival date & time 12/09/14  1917 History   First MD Initiated Contact with Patient 12/09/14 1958     Chief Complaint  Patient presents with  . Abdominal Pain  . Dental Pain     (Consider location/radiation/quality/duration/timing/severity/associated sxs/prior Treatment) HPI   24 year old female presents with multiple complaints. Patient reports she has recurrent dental pain and for the past 2 days she has had worsening dental pain. She mentioned pain is located to both right upper and lower teeth as well as left upper and lower teeth. Describe pain as a throbbing sensation, worsening with eating sweets, temperature changes, and chewing. She took a pain medication given from her uncle which did not provide any relief except causing drowsiness. She denies any dental injury, having fever, sore throat, or jaw pain. Furthermore, patient also notice increased urinary frequency for the past 3 weeks. Reports increase thirst but denies any generalized weakness or abdominal pain. Denies any burning on urination, vaginal bleeding or vaginal discharge. Her last menstrual period was 11/20/2014. Does have family history of diabetes.  Past Medical History  Diagnosis Date  . Genital HSV   . History of chlamydia   . Late prenatal care   . Abnormal Pap smear    History reviewed. No pertinent past surgical history. Family History  Problem Relation Age of Onset  . Diabetes Maternal Grandmother   . Hypertension Maternal Grandmother   . Diabetes Maternal Grandfather   . Hypertension Maternal Grandfather    Social History  Substance Use Topics  . Smoking status: Former Smoker    Quit date: 06/24/2013  . Smokeless tobacco: None  . Alcohol Use: Yes     Comment: occ   OB History    Gravida Para Term Preterm AB TAB SAB Ectopic Multiple Living   Review of Systems  All other systems reviewed and are negative.     Allergies  Review of patient's allergies  indicates no known allergies.  Home Medications   Prior to Admission medications   Medication Sig Start Date End Date Taking? Authorizing Provider  amoxicillin (AMOXIL) 500 MG capsule Take 1 capsule (500 mg total) by mouth 3 (three) times daily. Patient not taking: Reported on 11/24/2013 08/05/13   Marlon Pel, PA-C  diphenhydrAMINE (BENADRYL) 25 mg capsule Take 25 mg by mouth every 6 (six) hours as needed.    Historical Provider, MD  famotidine (PEPCID) 20 MG tablet Take 1 tablet (20 mg total) by mouth 2 (two) times daily. 06/03/14   Elpidio Anis, PA-C  HYDROcodone-acetaminophen (HYCET) 7.5-325 mg/15 ml solution Take 15 mLs by mouth every 4 (four) hours as needed for moderate pain or severe pain. Patient not taking: Reported on 05/16/2014 11/24/13   Garlon Hatchet, PA-C  hydrocortisone cream 1 % Apply 1 application topically 3 (three) times daily as needed for itching.    Historical Provider, MD  ibuprofen (ADVIL,MOTRIN) 600 MG tablet Take 1 tablet (600 mg total) by mouth every 6 (six) hours as needed. Patient not taking: Reported on 11/24/2013 08/11/13   Marlon Pel, PA-C  loratadine (CLARITIN) 10 MG tablet Take 1 tablet (10 mg total) by mouth daily. 06/03/14   Elpidio Anis, PA-C  predniSONE (DELTASONE) 10 MG tablet Take 6 on day 1 Take 5 on day 2 Take 4 on day 3 Take 3 on day 4 Take 2 on day 5 Take 1 on day 6 06/03/14  Elpidio AnisShari Upstill, PA-C  promethazine (PHENERGAN) 25 MG tablet Take 1 tablet (25 mg total) by mouth every 6 (six) hours as needed for nausea or vomiting. Patient not taking: Reported on 11/24/2013 08/05/13   Marlon Peliffany Greene, PA-C   BP 124/91 mmHg  Pulse 89  Temp(Src) 97.6 F (36.4 C) (Oral)  Resp 18  SpO2 98%  LMP 11/20/2014 Physical Exam  Constitutional: She appears well-developed and well-nourished. No distress.  HENT:  Head: Atraumatic.  Mouth: Well appearing teeth but tenderness noted to tooth #5 which is partially fractured, chronic. Tenderness to tooth #14 and 19,  and 28 without any obvious abscess or gum disease. No malocclusion, no trismus.  Eyes: Conjunctivae are normal.  Neck: Neck supple.  Cardiovascular: Normal rate and regular rhythm.   Pulmonary/Chest: Effort normal and breath sounds normal.  Abdominal: Soft. There is no tenderness.  Genitourinary:  No CVA tenderness  Neurological: She is alert.  Skin: No rash noted.  Psychiatric: She has a normal mood and affect.  Nursing note and vitals reviewed.   ED Course  Procedures (including critical care time)  Patient report dental pain. She has multiple dental pain without any obvious infection noted on exam. She'll be given antibiotic and pain medication. She also endorsed polyuria and polydipsia. No history of diabetes, will check CBG, pregnancy test and urinalysis. Patient otherwise well-appearing.  Labs Review Labs Reviewed  URINALYSIS, ROUTINE W REFLEX MICROSCOPIC ([facility])  PREGNANCY, URINE  POC URINE PREG, ED    Imaging Review No results found. I have personally reviewed and evaluated these images and lab results as part of my medical decision-making.   EKG Interpretation None      MDM   Final diagnoses:  Pain, dental    BP 124/91 mmHg  Pulse 89  Temp(Src) 97.6 F (36.4 C) (Oral)  Resp 18  SpO2 98%  LMP 11/20/2014     Fayrene HelperBowie Cannie Muckle, PA-C 12/09/14 2203  Tilden FossaElizabeth Rees, MD 12/09/14 2237

## 2015-01-20 NOTE — L&D Delivery Note (Signed)
Delivery Note At 6:24 AM a viable female was delivered via Vaginal, Spontaneous Delivery (Presentation: ROA ; vetex  ).  APGAR: 9 , 9 ; weight  pending.   Placenta status: delivered intact with gentle traction.  Cord: 3 vessel  with the following complications: none .    Anesthesia:  epidural Episiotomy:  none Lacerations:  none Est. Blood Loss (mL):  100  Mom to postpartum.  Baby to Couplet care / Skin to Skin.  Sonya Foster 11/06/2015, 6:39 AM

## 2015-01-24 ENCOUNTER — Encounter (HOSPITAL_COMMUNITY): Payer: Self-pay | Admitting: Emergency Medicine

## 2015-01-24 ENCOUNTER — Emergency Department (HOSPITAL_COMMUNITY)
Admission: EM | Admit: 2015-01-24 | Discharge: 2015-01-25 | Disposition: A | Discharge status: Home / Self Care | Payer: Medicaid Other | Attending: Emergency Medicine | Admitting: Emergency Medicine

## 2015-01-24 DIAGNOSIS — Z792 Long term (current) use of antibiotics: Secondary | ICD-10-CM | POA: Insufficient documentation

## 2015-01-24 DIAGNOSIS — Z79899 Other long term (current) drug therapy: Secondary | ICD-10-CM | POA: Insufficient documentation

## 2015-01-24 DIAGNOSIS — Z8619 Personal history of other infectious and parasitic diseases: Secondary | ICD-10-CM | POA: Insufficient documentation

## 2015-01-24 DIAGNOSIS — Z87891 Personal history of nicotine dependence: Secondary | ICD-10-CM | POA: Insufficient documentation

## 2015-01-24 DIAGNOSIS — J069 Acute upper respiratory infection, unspecified: Secondary | ICD-10-CM | POA: Insufficient documentation

## 2015-01-24 DIAGNOSIS — K0889 Other specified disorders of teeth and supporting structures: Secondary | ICD-10-CM | POA: Insufficient documentation

## 2015-01-24 NOTE — ED Notes (Signed)
Patient presents with left upper dental pain x1 day. No relief with ibuprofen at home. Rates pain 8/10.

## 2015-01-24 NOTE — ED Provider Notes (Signed)
CSN: [id number]     Arrival date & time 01/24/15  2237 History    No chief complaint on file.  The history is provided by the patient. No language interpreter was used.    HPI Comments: Sonya Foster is a 25 y.o. female who presents to the Emergency Department complaining of dental pain. Patient states dental pain started one day ago. Reports pain with eating. States she had a problem with a different tooth several weeks ago, however she called multiple dentists and unable to get an appointment until a week from now. She denies any fever or chills she denies any facial swelling. She took ibuprofen which did not help. She denies any injuries to the teeth. She denies any trismus. She also is complaining of mild nasal congestion, sore throat, cough. He states "everyone at work is sick." She has been taking over-the-counter cold medication with some relief. She denies any fever or chills. No chest pain or shortness of breath. Symptoms for 2 days.   Past Medical History  Diagnosis Date  . Genital HSV   . History of chlamydia   . Late prenatal care   . Abnormal Pap smear    History reviewed. No pertinent past surgical history. Family History  Problem Relation Age of Onset  . Diabetes Maternal Grandmother   . Hypertension Maternal Grandmother   . Diabetes Maternal Grandfather   . Hypertension Maternal Grandfather    Social History  Substance Use Topics  . Smoking status: Former Smoker    Quit date: 06/24/2013  . Smokeless tobacco: None  . Alcohol Use: Yes     Comment: occ   OB History    Gravida Para Term Preterm AB TAB SAB Ectopic Multiple Living   3 3 3       3      Review of Systems  Constitutional: Negative for fever and chills.  HENT: Positive for congestion, dental problem and sore throat.   Respiratory: Positive for cough. Negative for chest tightness and shortness of breath.   Cardiovascular: Negative for chest pain, palpitations and leg swelling.  Gastrointestinal: Negative  for vomiting and diarrhea.  Musculoskeletal: Negative for myalgias, arthralgias, neck pain and neck stiffness.  Skin: Negative for rash.  Neurological: Negative for dizziness, weakness and headaches.  All other systems reviewed and are negative.   Allergies  Review of patient's allergies indicates no known allergies.  Home Medications   Prior to Admission medications   Medication Sig Start Date End Date Taking? Authorizing Provider  amoxicillin (AMOXIL) 500 MG capsule Take 1 capsule (500 mg total) by mouth 3 (three) times daily. 12/09/14   Fayrene HelperBowie Tran, PA-C  diphenhydrAMINE (BENADRYL) 25 mg capsule Take 25 mg by mouth every 6 (six) hours as needed.    Historical Provider, MD  famotidine (PEPCID) 20 MG tablet Take 1 tablet (20 mg total) by mouth 2 (two) times daily. 06/03/14   Elpidio AnisShari Upstill, PA-C  HYDROcodone-acetaminophen (NORCO/VICODIN) 5-325 MG tablet Take 1 tablet by mouth every 6 (six) hours as needed for severe pain. 12/09/14   Fayrene HelperBowie Tran, PA-C  hydrocortisone cream 1 % Apply 1 application topically 3 (three) times daily as needed for itching.    Historical Provider, MD  ibuprofen (ADVIL,MOTRIN) 600 MG tablet Take 1 tablet (600 mg total) by mouth every 6 (six) hours as needed. 12/09/14   Fayrene HelperBowie Tran, PA-C  loratadine (CLARITIN) 10 MG tablet Take 1 tablet (10 mg total) by mouth daily. 06/03/14   Elpidio AnisShari Upstill, PA-C  predniSONE (DELTASONE) 10  MG tablet Take 6 on day 1 Take 5 on day 2 Take 4 on day 3 Take 3 on day 4 Take 2 on day 5 Take 1 on day 6 06/03/14   Elpidio Anis, PA-C  promethazine (PHENERGAN) 25 MG tablet Take 1 tablet (25 mg total) by mouth every 6 (six) hours as needed for nausea or vomiting. Patient not taking: Reported on 11/24/2013 08/05/13   Marlon Pel, PA-C   BP 111/75 mmHg  Pulse 75  Temp(Src) 97.5 F (36.4 C) (Oral)  Resp 18  SpO2 100%  LMP 01/19/2015 Physical Exam  Constitutional: She is oriented to person, place, and time. She appears well-developed and  well-nourished. No distress.  HENT:  Head: Normocephalic and atraumatic.  Right Ear: Tympanic membrane, external ear and ear canal normal.  Left Ear: Tympanic membrane, external ear and ear canal normal.  Nose: Rhinorrhea present.  Mouth/Throat: Uvula is midline, oropharynx is clear and moist and mucous membranes are normal.  Unremarkable dentition, no obvious cavities. tender to palpation of the left upper first molar. There is surrounding gum swelling and tenderness.  Eyes: Conjunctivae are normal.  Cardiovascular: Normal rate, regular rhythm and normal heart sounds.   Pulmonary/Chest: Effort normal and breath sounds normal.  Abdominal: She exhibits no distension.  Neurological: She is alert and oriented to person, place, and time.  Skin: Skin is warm and dry.  Psychiatric: She has a normal mood and affect.  Nursing note and vitals reviewed.   ED Course  Procedures  DIAGNOSTIC STUDIES:    Oxygen Saturation is 100% on RA, normal by my interpretation.   COORDINATION OF CARE:   Labs Review Labs Reviewed - No data to display  Imaging Review No results found. I have personally reviewed and evaluated these images and lab results as part of my medical decision-making.   EKG Interpretation None      MDM   Final diagnoses:  Pain, dental  URI (upper respiratory infection)    Patient with dental pain for a day. She does have some gum swelling around the painful tooth. She has no trismus or swelling under the tongue. She is afebrile. No evidence of Ludwig's angina. There is no facial swelling. Will start amoxicillin, ibuprofen for pain. Follow-up with a dentist.  Filed Vitals:   01/24/15 2306 01/25/15 0043  BP: 111/75 121/68  Pulse: 75 78  Temp: 97.5 F (36.4 C)   TempSrc: Oral   Resp: 18 16  SpO2: 100% 100%        Jaynie Crumble, PA-C 01/25/15 0048  April Palumbo, MD 01/25/15 [phone]

## 2015-01-25 MED ORDER — IBUPROFEN 600 MG PO TABS: 600.0000 mg | ORAL_TABLET | Freq: Four times a day (QID) | ORAL | Status: DC | PRN

## 2015-01-25 MED ORDER — AMOXICILLIN 500 MG PO CAPS: 500.0000 mg | ORAL_CAPSULE | Freq: Three times a day (TID) | ORAL | Status: DC

## 2015-01-25 NOTE — Discharge Instructions (Signed)
Take amoxicillin as prescribed until all gone for dental infection. Try oragel topically. Ibuprofen for pain. Follow up with a dentist. Unfortunately we do not have on call. You can call your insurance company for suggestions.    Dental Pain Dental pain may be caused by many things, including:  Tooth decay (cavities or caries). Cavities expose the nerve of your tooth to air and hot or cold temperatures. This can cause pain or discomfort.  Abscess or infection. A dental abscess is a collection of infected pus from a bacterial infection in the inner part of the tooth (pulp). It usually occurs at the end of the tooth's root.  Injury.  An unknown reason (idiopathic). Your pain may be mild or severe. It may only occur when:  You are chewing.  You are exposed to hot or cold temperature.  You are eating or drinking sugary foods or beverages, such as soda or candy. Your pain may also be constant. HOME CARE INSTRUCTIONS Watch your dental pain for any changes. The following actions may help to lessen any discomfort that you are feeling:  Take medicines only as directed by your dentist.  If you were prescribed an antibiotic medicine, finish all of it even if you start to feel better.  Keep all follow-up visits as directed by your dentist. This is important.  Do not apply heat to the outside of your face.  Rinse your mouth or gargle with salt water if directed by your dentist. This helps with pain and swelling.  You can make salt water by adding  tsp of salt to 1 cup of warm water.  Apply ice to the painful area of your face:  Put ice in a plastic bag.  Place a towel between your skin and the bag.  Leave the ice on for 20 minutes, 2-3 times per day.  Avoid foods or drinks that cause you pain, such as:  Very hot or very cold foods or drinks.  Sweet or sugary foods or drinks. SEEK MEDICAL CARE IF:  Your pain is not controlled with medicines.  Your symptoms are worse.  You  have new symptoms. SEEK IMMEDIATE MEDICAL CARE IF:  You are unable to open your mouth.  You are having trouble breathing or swallowing.  You have a fever.  Your face, neck, or jaw is swollen.   This information is not intended to replace advice given to you by your health care provider. Make sure you discuss any questions you have with your health care provider.   Document Released: 01/05/2005 Document Revised: 05/22/2014 Document Reviewed: 01/01/2014 Elsevier Interactive Patient Education [employer]2016 Elsevier Inc.

## 2015-02-19 ENCOUNTER — Emergency Department (HOSPITAL_COMMUNITY)
Admission: EM | Admit: 2015-02-19 | Discharge: 2015-02-20 | Disposition: A | Discharge status: Home / Self Care | Payer: Medicaid Other | Attending: Emergency Medicine | Admitting: Emergency Medicine

## 2015-02-19 ENCOUNTER — Encounter (HOSPITAL_COMMUNITY): Payer: Self-pay

## 2015-02-19 DIAGNOSIS — Z3202 Encounter for pregnancy test, result negative: Secondary | ICD-10-CM | POA: Insufficient documentation

## 2015-02-19 DIAGNOSIS — S0512XA Contusion of eyeball and orbital tissues, left eye, initial encounter: Secondary | ICD-10-CM

## 2015-02-19 DIAGNOSIS — Z87891 Personal history of nicotine dependence: Secondary | ICD-10-CM | POA: Insufficient documentation

## 2015-02-19 DIAGNOSIS — S0012XA Contusion of left eyelid and periocular area, initial encounter: Secondary | ICD-10-CM | POA: Insufficient documentation

## 2015-02-19 DIAGNOSIS — Y998 Other external cause status: Secondary | ICD-10-CM | POA: Insufficient documentation

## 2015-02-19 DIAGNOSIS — Z8619 Personal history of other infectious and parasitic diseases: Secondary | ICD-10-CM | POA: Insufficient documentation

## 2015-02-19 DIAGNOSIS — Y9389 Activity, other specified: Secondary | ICD-10-CM | POA: Insufficient documentation

## 2015-02-19 DIAGNOSIS — Y9289 Other specified places as the place of occurrence of the external cause: Secondary | ICD-10-CM | POA: Insufficient documentation

## 2015-02-19 DIAGNOSIS — N390 Urinary tract infection, site not specified: Secondary | ICD-10-CM | POA: Insufficient documentation

## 2015-02-19 MED ORDER — ACETAMINOPHEN 325 MG PO TABS
650.0000 mg | ORAL_TABLET | Freq: Once | ORAL | Status: AC
  Administered 2015-02-20: 650 mg via ORAL
  Filled 2015-02-19: qty 2

## 2015-02-19 MED ORDER — IBUPROFEN 800 MG PO TABS
800.0000 mg | ORAL_TABLET | Freq: Once | ORAL | Status: AC
  Administered 2015-02-20: 800 mg via ORAL
  Filled 2015-02-19: qty 1

## 2015-02-19 NOTE — ED Notes (Signed)
Pt would also like to have her urine tested, she believes she has UTI due to "tugging/pulling" sensation at the end of her stream when she voids.
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
## 2015-02-20 LAB — URINALYSIS, ROUTINE W REFLEX MICROSCOPIC
BILIRUBIN URINE: NEGATIVE
Glucose, UA: NEGATIVE mg/dL
KETONES UR: NEGATIVE mg/dL
NITRITE: NEGATIVE
PROTEIN: NEGATIVE mg/dL
Specific Gravity, Urine: 1.017 (ref 1.005–1.030)
pH: 6.5 (ref 5.0–8.0)

## 2015-02-20 LAB — URINE MICROSCOPIC-ADD ON

## 2015-02-20 LAB — POC URINE PREG, ED: Preg Test, Ur: NEGATIVE

## 2015-02-20 MED ORDER — CEPHALEXIN 500 MG PO CAPS: 500.0000 mg | ORAL_CAPSULE | Freq: Two times a day (BID) | ORAL | Status: DC

## 2015-02-20 MED ORDER — CEPHALEXIN 500 MG PO CAPS
500.0000 mg | ORAL_CAPSULE | Freq: Once | ORAL | Status: AC
  Administered 2015-02-20: 500 mg via ORAL
  Filled 2015-02-20: qty 1

## 2015-02-20 NOTE — Discharge Instructions (Signed)
For pain control you may take:   of ibuprofen (that is usually 4 over the counter pills)  3 times a day (take with food) and acetaminophen  (this is 3 over the counter pills) four times a day.    Take your antibiotics as directed and to completion. You should never have any leftover antibiotics! Push fluids and stay well hydrated.   Any antibiotic use can reduce the efficacy of hormonal birth control. Please use back up method of contraception.   Do not hesitate to return to the emergency room for any new, worsening or concerning symptoms.  Please obtain primary care using resource guide below. Let them know that you were seen in the emergency room and that they will need to obtain records for further outpatient management.   Urinary Tract Infection A urinary tract infection (UTI) can occur any place along the urinary tract. The tract includes the kidneys, ureters, bladder, and urethra. A type of germ called bacteria often causes a UTI. UTIs are often helped with antibiotic medicine.  HOME CARE   If given, take antibiotics as told by your doctor. Finish them even if you start to feel better.  Drink enough fluids to keep your pee (urine) clear or pale yellow.  Avoid tea, drinks with caffeine, and bubbly (carbonated) drinks.  Pee often. Avoid holding your pee in for a long time.  Pee before and after having sex (intercourse).  Wipe from front to back after you poop (bowel movement) if you are a woman. Use each tissue only once. GET HELP RIGHT AWAY IF:   You have back pain.  You have lower belly (abdominal) pain.  You have chills.  You feel sick to your stomach (nauseous).  You throw up (vomit).  Your burning or discomfort with peeing does not go away.  You have a fever.  Your symptoms are not better in 3 days. MAKE SURE YOU:   Understand these instructions.  Will watch your condition.  Will get help right away if you are not doing well or get worse.   This  information is not intended to replace advice given to you by your health care provider. Make sure you discuss any questions you have with your health care provider.   Document Released: 06/24/2007 Document Revised: 01/26/2014 Document Reviewed: 08/06/2011 Elsevier Interactive Patient Education 2016 [employer].   Emergency Department Resource Guide 1) Find a Doctor and Pay Out of Pocket Although you won't have to find out who is covered by your insurance plan, it is a good idea to ask around and get recommendations. You will then need to call the office and see if the doctor you have chosen will accept you as a new patient and what types of options they offer for patients who are self-pay. Some doctors offer discounts or will set up payment plans for their patients who do not have insurance, but you will need to ask so you aren't surprised when you get to your appointment.  2) Contact Your Local Health Department Not all health departments have doctors that can see patients for sick visits, but many do, so it is worth a call to see if yours does. If you don't know where your local health department is, you can check in your phone book. The CDC also has a tool to help you locate your state's health department, and many state websites also have listings of all of their local health departments.  3) Find a Walk-in Clinic If your illness  is not likely to be very severe or complicated, you may want to try a walk in clinic. These are popping up all over the country in pharmacies, drugstores, and shopping centers. They're usually staffed by nurse practitioners or physician assistants that have been trained to treat common illnesses and complaints. They're usually fairly quick and inexpensive. However, if you have serious medical issues or chronic medical problems, these are probably not your best option.  No Primary Care Doctor: - Call Health Connect at  [phone] - they can help you locate a primary  care doctor that  accepts your insurance, provides certain services, etc. - Physician Referral Service- [phone]  Chronic Pain Problems: Organization         Address  Phone   Notes  [facility]  [phone] Patients need to be referred by their primary care doctor.   Medication Assistance: Organization         Address  Phone   Notes  [facility] Medication [facility] [address] [place] [place], [place] [address] [phone] --Must be a resident of [facility] -- Must have NO insurance coverage whatsoever (no Medicaid/ Medicare, etc.) -- The pt. MUST have a primary care doctor that directs their care regularly and follows them in the community   [facility]  [phone]   [employer]  [phone]    Agencies that provide inexpensive medical care: Organization         Address  Phone   Notes  [facility]  [phone]   [facility]    [phone]   [facility] [address] [place], [place] [address] [phone]   [facility] [address] [place] [place] [phone]   Planned Parenthood    [phone]   [facility]    [phone]   [facility]  [address], [place] Phone:  [phone], Fax:  [phone] Hours of Operation:  9 am - 6 pm, M-F.  Also accepts Medicaid/Medicare and self-pay.  [facility]  [address], [place] Phone: [phone], Fax: [phone]. Hours of Operation:  8:30 am - 5:30 pm, M-F.  Also accepts Medicaid and self-pay.  [facility] [place] [address], [place] Phone: [phone]   [facility] [address] Natasha Bence [place], [place] [phone], Ext. [phone] Mondays & Thursdays: 7-9 AM.  First 15 patients are seen on a first come, first serve basis.    Medicaid-accepting [facility]  Providers:  Organization         Address  Phone   Notes  [facility] [address], [place] [phone] Also accepts self-pay patients.  [facility] [address] Laurell Josephs [place], [place]  [phone]   [facility] [address], [place] [phone]   [facility] [address] [place] [phone]   Renaye Rakers [address], [place]   [phone] Only accepts [place] Access [place] patients after they have their name applied to their card.   Self-Pay (no insurance) in [facility]:  Organization         Address  Phone   Notes  Sickle Cell Patients, [facility] [address] [place], [place] [phone]  [facility] Care [place] [phone]   [facility] [place]  [place], [place] [phone]   [facility]/Dr. Osei-Bonsu  [address] [place] or [address], [place] [phone] Phone number for both [place] and [place] locations is the same.  [facility] [address], [place] [phone]   [facility] [address], [place] or [address] [phone]   [facility] [address], [place] [phone], phone; [phone], fax Sees patients 1st and 3rd Saturday of every month.  Must not qualify for public or private insurance (i.e. Medicaid, Medicare, [place] Health Choice, Veterans' Benefits)  Household income should be no more than 200% of the poverty level The clinic cannot treat you if you are pregnant or think you are pregnant  Sexually transmitted diseases are not treated at the clinic.    Dental Care: Organization         Address  Phone  Notes  [facility] [place] [phone] Accepts children up to age 34 who are enrolled in [place] or [place]; pregnant women with a Medicaid card; and children who have applied for Medicaid or [place] Health Choice, but were declined, whose parents can pay a reduced fee at time of service.  [facility]  [address], [place] [phone] Accepts children up to age 34 who are enrolled in [place] or [place]; pregnant women with a Medicaid card; and children who have applied for Medicaid or  Health Choice, but were declined, whose parents can pay a reduced fee at time of service.  [facility]  [place] [phone] Patients are seen by appointment only. Walk-ins are not accepted. [place] will see patients 69 years of age and older. Monday - Tuesday (8am-5pm) Most Wednesdays (8:30-5pm) $30 per visit, cash only  [facility] Adult [facility] Access PRO[facility]  [address], [facility] [phone] Patients are seen by appointment only. Walk-ins are not accepted. [place] will see patients 28 years of age and older. One Wednesday Evening (Monthly: Volunteer Based).  $30 per visit, cash only  [place]  [phone] for adults; Children under age 32, call Graduate Pediatric Dentistry at [phone]. Children aged 77-14, please call [phone] to request a pediatric application.  Dental services are provided in all areas of dental care including fillings, crowns and bridges, complete and partial dentures, implants, gum treatment, root canals, and extractions. Preventive care is also provided. Treatment is provided to both adults and children. Patients are selected via a lottery and there is often a waiting list.   [facility] [address] [place]  [phone] [email or web address]   [facility]  [address] [place], [place] [phone], Ext. [phone] Second and Fourth Thursday of each month, opens at 6:30 AM; Clinic ends at 9 AM.  Patients are seen on a first-come first-served basis, and a limited number are seen during each clinic.   [facility]  [address] Hillard Danker [place], [place] [phone]   Eligibility Requirements You must have lived in [place], [place], or [place] counties for at least the  last three months.   You cannot be eligible for state or federal sponsored [employer], including [employer], [place], or [employer].   You generally cannot be eligible for healthcare insurance through your employer.    How to apply: Eligibility screenings are held every Tuesday and Wednesday afternoon from 1:00 pm until 4:00 pm. You do not need an appointment for the interview!  [facility] [address], [place], [place]   [place]  [place] Department  [place]  [phone]    Behavioral Health Resources in the Community: Intensive Outpatient Programs Organization         Address  Phone  Notes  [facility] [place]. [address], [place], [place] [phone]   [facility] [address] [place], [place]   ADS: [facility] [address], [place], [place]   [place] [address]  [place], [place] or [phone]   Substance Abuse Resources Organization         Address  Phone  Notes  Alcohol and Drug Services  [phone]   [place]  [phone]   [facility] [place]   Chinita Pester  [phone]   Residential & Outpatient Substance Abuse Program  [phone]   Psychological Services Organization         Address  Phone  Notes  [facility] [place]  [place]  [phone]   [place] [address], [place] or [phone]    Mobile Crisis Teams Organization         Address  Phone  Notes  [facility], Mobile Crisis Care Unit  [phone]   [facility]  [address] [place], [place]   [place] [address], [place] [phone]    Self-Help/Support Groups Organization         Address  Phone             Notes  [place]. of [place] - variety of support groups  [place] Call for more information  Narcotics Anonymous (NA), [facility] [address], [employer] [place]  2 meetings at this location   Special educational needs teacher         Address  Phone  Notes  [facility]idential [facility] [place],    [place]  [phone]   [facility]  [address], [place] [phone], [place], [place]   [place], [place] [phone] Admissions: 8am-3pm M-F  Incentives Substance [place] [address]    [place], [place] [phone]   [facility] [address] [place], [place], [place]   [facility] [address]  [place], [place]   [facility] - Intensive Outpatient [address] Krist[address], [place], [place]   [facility] [place].,  [place], [place] [phone] or [phone]   [facility] ([facility]) [address], [place], [place] Accepts Medicaid  [facility] [address]  [place] [phone] Substance Abuse/Addiction Treatment   [facility] Organization         Address  Phone  Notes  [facility]  [phone]   Almyra Free  Alvan Dame, PhD [address] Ervin Knack [place], [place]   [phone] or [phone]    [facility] Behavioral   [address] [place], [place] [phone]   [facility] [address], [place], [place] [phone] Insurance/Medicaid/sponsorship through [facility] and Families [address]                                    [place], [place] [phone] Therapy/tele-psych/case  [facility] [address] [place], [place] [phone]    Dr. Lolly Mustache  [phone]   [facility] [place]  [employer] [facility] Dept. 1) [address] [place] 2) [address], [place] 3)  [address], [place] [phone]  [phone]   [facility] Child Abuse Hotline (385)315-7425 or [phone] (After Hours)

## 2015-02-22 LAB — URINE CULTURE: Culture: >=100000

## 2015-02-23 ENCOUNTER — Telehealth (HOSPITAL_BASED_OUTPATIENT_CLINIC_OR_DEPARTMENT_OTHER): Payer: Self-pay | Admitting: Emergency Medicine

## 2015-02-23 NOTE — Telephone Encounter (Signed)
Post ED Visit - Positive Culture Follow-up  Culture report reviewed by antimicrobial stewardship pharmacist:   Enzo Bi, Pharm.D.  Celedonio Miyamoto, Pharm.D., BCPS  Garvin Fila, Pharm.D.  Georgina Pillion, Pharm.D., BCPS  [place], [address].D., BCPS, AAHIVP  Estella Husk, Pharm.D., BCPS, AAHIVP  Tennis Must, Pharm.D.  Rob Oswaldo Done, [address].D.  Positive urine culture Staph Treated with cephalexin, organism sensitive to the same and no further patient follow-up is required at this time.  Berle Mull 02/23/2015, 2:03 PM

## 2015-03-05 ENCOUNTER — Encounter (HOSPITAL_COMMUNITY): Payer: Self-pay | Admitting: Emergency Medicine

## 2015-03-05 ENCOUNTER — Emergency Department (HOSPITAL_COMMUNITY)
Admission: EM | Admit: 2015-03-05 | Discharge: 2015-03-05 | Disposition: A | Discharge status: Home / Self Care | Payer: Medicaid Other | Attending: Emergency Medicine | Admitting: Emergency Medicine

## 2015-03-05 DIAGNOSIS — K0889 Other specified disorders of teeth and supporting structures: Secondary | ICD-10-CM

## 2015-03-05 DIAGNOSIS — K002 Abnormalities of size and form of teeth: Secondary | ICD-10-CM | POA: Insufficient documentation

## 2015-03-05 DIAGNOSIS — Z8619 Personal history of other infectious and parasitic diseases: Secondary | ICD-10-CM | POA: Insufficient documentation

## 2015-03-05 DIAGNOSIS — K029 Dental caries, unspecified: Secondary | ICD-10-CM | POA: Insufficient documentation

## 2015-03-05 DIAGNOSIS — Z792 Long term (current) use of antibiotics: Secondary | ICD-10-CM | POA: Insufficient documentation

## 2015-03-05 DIAGNOSIS — K047 Periapical abscess without sinus: Secondary | ICD-10-CM | POA: Insufficient documentation

## 2015-03-05 DIAGNOSIS — Z87891 Personal history of nicotine dependence: Secondary | ICD-10-CM | POA: Insufficient documentation

## 2015-03-05 MED ORDER — PENICILLIN V POTASSIUM 500 MG PO TABS: 500.0000 mg | ORAL_TABLET | Freq: Two times a day (BID) | ORAL | Status: AC

## 2015-03-05 MED ORDER — PENICILLIN V POTASSIUM 500 MG PO TABS
500.0000 mg | ORAL_TABLET | Freq: Once | ORAL | Status: AC
  Administered 2015-03-05: 500 mg via ORAL
  Filled 2015-03-05: qty 1

## 2015-03-05 MED ORDER — OXYCODONE-ACETAMINOPHEN 5-325 MG PO TABS
1.0000 | ORAL_TABLET | Freq: Once | ORAL | Status: AC
  Administered 2015-03-05: 1 via ORAL
  Filled 2015-03-05: qty 1

## 2015-03-05 NOTE — ED Notes (Signed)
Left upper tooth pain since yesterday. States her face is swollen, when compared to ID no obvious swelling observed. No obvious dental caries/abscess observed near stated area of pain.

## 2015-03-05 NOTE — Discharge Instructions (Signed)
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
## 2015-03-05 NOTE — ED Provider Notes (Signed)
CSN: [id number]     Arrival date & time 03/05/15  1326 History  By signing my name below, I, Sonya Foster, attest that this documentation has been prepared under the direction and in the presence of [employer], PA-C.  Electronically Signed: Murriel Foster, ED Scribe. 03/05/2015. 3:30 PM.    Chief Complaint  Patient presents with  . Dental Pain     Patient is a 25 y.o. female presenting with tooth pain. The history is provided by the patient. No language interpreter was used.  Dental Pain Associated symptoms: facial swelling   Associated symptoms: no fever    HPI Comments: Sonya Foster is a 25 y.o. female who presents to the Emergency Department complaining of constant, worsening left-sided maxillary dental pain that has been present since yesterday. Pt reports she does not have a dentist she regularly sees and states her Medicaid does not cover her dental care. Pt also reports associated facial swelling to the left upper jaw. Pt has not taken anything to try to relieve her pain. Pt denies fever, chills, vomiting.    Past Medical History  Diagnosis Date  . Genital HSV   . History of chlamydia   . Late prenatal care   . Abnormal Pap smear    History reviewed. No pertinent past surgical history. Family History  Problem Relation Age of Onset  . Diabetes Maternal Grandmother   . Hypertension Maternal Grandmother   . Diabetes Maternal Grandfather   . Hypertension Maternal Grandfather    Social History  Substance Use Topics  . Smoking status: Former Smoker    Quit date: 06/24/2013  . Smokeless tobacco: None  . Alcohol Use: Yes     Comment: occ   OB History    Gravida Para Term Preterm AB TAB SAB Ectopic Multiple Living   Review of Systems  Constitutional: Negative for fever and chills.  HENT: Positive for dental problem and facial swelling.   Gastrointestinal: Negative for vomiting.      Allergies  Review of patient's allergies indicates no  known allergies.  Home Medications   Prior to Admission medications   Medication Sig Start Date End Date Taking? Authorizing Provider  amoxicillin (AMOXIL) 500 MG capsule Take 1 capsule (500 mg total) by mouth 3 (three) times daily. Patient not taking: Reported on 02/19/2015 01/25/15   Tatyana Kirichenko, PA-C  cephALEXin (KEFLEX) 500 MG capsule Take 1 capsule (500 mg total) by mouth 2 (two) times daily. 02/20/15   Nicole Pisciotta, PA-C  famotidine (PEPCID) 20 MG tablet Take 1 tablet (20 mg total) by mouth 2 (two) times daily. Patient not taking: Reported on 02/19/2015 06/03/14   Elpidio Anis, PA-C  HYDROcodone-acetaminophen (NORCO/VICODIN) 5-325 MG tablet Take 1 tablet by mouth every 6 (six) hours as needed for severe pain. Patient not taking: Reported on 02/19/2015 12/09/14   Fayrene Helper, PA-C  ibuprofen (ADVIL,MOTRIN) 600 MG tablet Take 1 tablet (600 mg total) by mouth every 6 (six) hours as needed. Patient not taking: Reported on 02/19/2015 01/25/15   Tatyana Kirichenko, PA-C  loratadine (CLARITIN) 10 MG tablet Take 1 tablet (10 mg total) by mouth daily. Patient not taking: Reported on 02/19/2015 06/03/14   Elpidio Anis, PA-C  penicillin v potassium (VEETID) 500 MG tablet Take 1 tablet (500 mg total) by mouth 2 (two) times daily. 03/05/15 03/12/15  Nevyn Bossman Patel-Mills, PA-C   BP 127/94 mmHg  Pulse 79  Temp(Src) 98.1 F (36.7  C) (Oral)  Resp 16  SpO2 100%  LMP 02/12/2015 Physical Exam  Constitutional: She is oriented to person, place, and time. She appears well-developed and well-nourished.  HENT:  Head: Normocephalic and atraumatic.  Mouth/Throat: Uvula is midline. Abnormal dentition. Dental caries present. No dental abscesses.    Patient has left-sided facial swelling. No anterior cervical swelling or lymphadenopathy. No drooling or trismus. Handling secretions well.  Eyes: Conjunctivae are normal.  Neck: Normal range of motion. Neck supple.  Cardiovascular: Normal rate.   Pulmonary/Chest:  Effort normal.  Musculoskeletal: Normal range of motion.  Neurological: She is alert and oriented to person, place, and time.  Skin: Skin is warm and dry.  Psychiatric: She has a normal mood and affect.  Nursing note and vitals reviewed.   ED Course  Procedures (including critical care time)  DIAGNOSTIC STUDIES: Oxygen Saturation is 100% on room air, normal by my interpretation.    COORDINATION OF CARE: 3:29 PM Discussed treatment plan with pt at bedside and pt agreed to plan.   Labs Review Labs Reviewed - No data to display  Imaging Review No results found.   EKG Interpretation None      MDM   Final diagnoses:  Pain, dental  Dental infection  Patient with dentalgia.  No abscess requiring immediate incision and drainage.  Exam not concerning for Ludwig's angina or pharyngeal abscess.  Will treat with penicillin due to left-sided facial swelling. Patient's pain was treated on the ED but I did not prescribe narcotic pain medication to go home with. She has been seen 3 times this year for dental pain. Pt instructed to follow-up with dentist.  Discussed return precautions. Pt safe for discharge.  I personally performed the services described in this documentation, which was scribed in my presence. The recorded information has been reviewed and is accurate.    Catha Gosselin, PA-C 03/05/15 1635  Alvira Monday, MD 03/06/15 [phone]

## 2015-04-20 ENCOUNTER — Inpatient Hospital Stay (HOSPITAL_COMMUNITY)
Admission: AD | Admit: 2015-04-20 | Discharge: 2015-04-21 | Disposition: A | Discharge status: Home / Self Care | Payer: Medicaid Other | Source: Ambulatory Visit | Attending: Obstetrics & Gynecology | Admitting: Obstetrics & Gynecology

## 2015-04-20 DIAGNOSIS — R109 Unspecified abdominal pain: Secondary | ICD-10-CM | POA: Diagnosis not present

## 2015-04-20 DIAGNOSIS — O98311 Other infections with a predominantly sexual mode of transmission complicating pregnancy, first trimester: Secondary | ICD-10-CM | POA: Diagnosis not present

## 2015-04-20 DIAGNOSIS — K5900 Constipation, unspecified: Secondary | ICD-10-CM

## 2015-04-20 DIAGNOSIS — A6000 Herpesviral infection of urogenital system, unspecified: Secondary | ICD-10-CM | POA: Insufficient documentation

## 2015-04-20 DIAGNOSIS — Z3A11 11 weeks gestation of pregnancy: Secondary | ICD-10-CM | POA: Diagnosis not present

## 2015-04-20 DIAGNOSIS — O26891 Other specified pregnancy related conditions, first trimester: Secondary | ICD-10-CM | POA: Diagnosis not present

## 2015-04-20 DIAGNOSIS — Z87891 Personal history of nicotine dependence: Secondary | ICD-10-CM | POA: Insufficient documentation

## 2015-04-20 DIAGNOSIS — M545 Low back pain: Secondary | ICD-10-CM | POA: Diagnosis present

## 2015-04-20 DIAGNOSIS — O26899 Other specified pregnancy related conditions, unspecified trimester: Secondary | ICD-10-CM

## 2015-04-20 NOTE — MAU Note (Addendum)
Having a lot of lower back pain, upper and lower abd pain. Headaches and dizzy sometimes. SOB sometimes. Pain present for a month. Some of abd pain is gas. Pt states had pos home upt first of March

## 2015-04-21 ENCOUNTER — Encounter (HOSPITAL_COMMUNITY): Payer: Self-pay | Admitting: *Deleted

## 2015-04-21 DIAGNOSIS — Z3A11 11 weeks gestation of pregnancy: Secondary | ICD-10-CM

## 2015-04-21 DIAGNOSIS — K5900 Constipation, unspecified: Secondary | ICD-10-CM

## 2015-04-21 DIAGNOSIS — R109 Unspecified abdominal pain: Secondary | ICD-10-CM

## 2015-04-21 DIAGNOSIS — O9989 Other specified diseases and conditions complicating pregnancy, childbirth and the puerperium: Secondary | ICD-10-CM

## 2015-04-21 LAB — CBC
HCT: 35.5 % — ABNORMAL LOW (ref 36.0–46.0)
Hemoglobin: 12.8 g/dL (ref 12.0–15.0)
MCH: 29.6 pg (ref 26.0–34.0)
MCHC: 36.1 g/dL — ABNORMAL HIGH (ref 30.0–36.0)
MCV: 82 fL (ref 78.0–100.0)
Platelets: 211 10*3/uL (ref 150–400)
RBC: 4.33 MIL/uL (ref 3.87–5.11)
RDW: 13 % (ref 11.5–15.5)
WBC: 6.7 10*3/uL (ref 4.0–10.5)

## 2015-04-21 LAB — URINALYSIS, ROUTINE W REFLEX MICROSCOPIC
BILIRUBIN URINE: NEGATIVE
Glucose, UA: NEGATIVE mg/dL
HGB URINE DIPSTICK: NEGATIVE
KETONES UR: 15 mg/dL — AB
Leukocytes, UA: NEGATIVE
Nitrite: NEGATIVE
Protein, ur: NEGATIVE mg/dL
SPECIFIC GRAVITY, URINE: 1.02 (ref 1.005–1.030)
pH: 6 (ref 5.0–8.0)

## 2015-04-21 LAB — HIV ANTIBODY (ROUTINE TESTING W REFLEX): HIV Screen 4th Generation wRfx: NONREACTIVE

## 2015-04-21 LAB — WET PREP, GENITAL
SPERM: NONE SEEN
Trich, Wet Prep: NONE SEEN
WBC WET PREP: NONE SEEN
Yeast Wet Prep HPF POC: NONE SEEN

## 2015-04-21 LAB — POCT PREGNANCY, URINE: Preg Test, Ur: POSITIVE — AB

## 2015-04-21 MED ORDER — POLYETHYLENE GLYCOL 3350 17 G PO PACK: 17.0000 g | PACK | Freq: Every day | ORAL | Status: DC

## 2015-04-21 NOTE — Discharge Instructions (Signed)
First Trimester of Pregnancy °The first trimester of pregnancy is from week 1 until the end of week 12 (months 1 through 3). A week after a sperm fertilizes an egg, the egg will implant on the wall of the uterus. This embryo will begin to develop into a baby. Genes from you and your partner are forming the baby. The female genes determine whether the baby is a boy or a girl. At 6-8 weeks, the eyes and face are formed, and the heartbeat can be seen on ultrasound. At the end of 12 weeks, all the baby's organs are formed.  °Now that you are pregnant, you will want to do everything you can to have a healthy baby. Two of the most important things are to get good prenatal care and to follow your health care provider's instructions. Prenatal care is all the medical care you receive before the baby's birth. This care will help prevent, find, and treat any problems during the pregnancy and childbirth. °BODY CHANGES °Your body goes through many changes during pregnancy. The changes vary from woman to woman.  °· You may gain or lose a couple of pounds at first. °· You may feel sick to your stomach (nauseous) and throw up (vomit). If the vomiting is uncontrollable, call your health care provider. °· You may tire easily. °· You may develop headaches that can be relieved by medicines approved by your health care provider. °· You may urinate more often. Painful urination may mean you have a bladder infection. °· You may develop heartburn as a result of your pregnancy. °· You may develop constipation because certain hormones are causing the muscles that push waste through your intestines to slow down. °· You may develop hemorrhoids or swollen, bulging veins (varicose veins). °· Your breasts may begin to grow larger and become tender. Your nipples may stick out more, and the tissue that surrounds them (areola) may become darker. °· Your gums may bleed and may be sensitive to brushing and flossing. °· Dark spots or blotches (chloasma,  mask of pregnancy) may develop on your face. This will likely fade after the baby is born. °· Your menstrual periods will stop. °· You may have a loss of appetite. °· You may develop cravings for certain kinds of food. °· You may have changes in your emotions from day to day, such as being excited to be pregnant or being concerned that something may go wrong with the pregnancy and baby. °· You may have more vivid and strange dreams. °· You may have changes in your hair. These can include thickening of your hair, rapid growth, and changes in texture. Some women also have hair loss during or after pregnancy, or hair that feels dry or thin. Your hair will most likely return to normal after your baby is born. °WHAT TO EXPECT AT YOUR PRENATAL VISITS °During a routine prenatal visit: °· You will be weighed to make sure you and the baby are growing normally. °· Your blood pressure will be taken. °· Your abdomen will be measured to track your baby's growth. °· The fetal heartbeat will be listened to starting around week 10 or 12 of your pregnancy. °· Test results from any previous visits will be discussed. °Your health care provider may ask you: °· How you are feeling. °· If you are feeling the baby move. °· If you have had any abnormal symptoms, such as leaking fluid, bleeding, severe headaches, or abdominal cramping. °· If you are using any tobacco products,   including cigarettes, chewing tobacco, and electronic cigarettes. °· If you have any questions. °Other tests that may be performed during your first trimester include: °· Blood tests to find your blood type and to check for the presence of any previous infections. They will also be used to check for low iron levels (anemia) and Rh antibodies. Later in the pregnancy, blood tests for diabetes will be done along with other tests if problems develop. °· Urine tests to check for infections, diabetes, or protein in the urine. °· An ultrasound to confirm the proper growth  and development of the baby. °· An amniocentesis to check for possible genetic problems. °· Fetal screens for spina bifida and Down syndrome. °· You may need other tests to make sure you and the baby are doing well. °· HIV (human immunodeficiency virus) testing. Routine prenatal testing includes screening for HIV, unless you choose not to have this test. °HOME CARE INSTRUCTIONS  °Medicines °· Follow your health care provider's instructions regarding medicine use. Specific medicines may be either safe or unsafe to take during pregnancy. °· Take your prenatal vitamins as directed. °· If you develop constipation, try taking a stool softener if your health care provider approves. °Diet °· Eat regular, well-balanced meals. Choose a variety of foods, such as meat or vegetable-based protein, fish, milk and low-fat dairy products, vegetables, fruits, and whole grain breads and cereals. Your health care provider will help you determine the amount of weight gain that is right for you. °· Avoid raw meat and uncooked cheese. These carry germs that can cause birth defects in the baby. °· Eating four or five small meals rather than three large meals a day may help relieve nausea and vomiting. If you start to feel nauseous, eating a few soda crackers can be helpful. Drinking liquids between meals instead of during meals also seems to help nausea and vomiting. °· If you develop constipation, eat more high-fiber foods, such as fresh vegetables or fruit and whole grains. Drink enough fluids to keep your urine clear or pale yellow. °Activity and Exercise °· Exercise only as directed by your health care provider. Exercising will help you: °¨ Control your weight. °¨ Stay in shape. °¨ Be prepared for labor and delivery. °· Experiencing pain or cramping in the lower abdomen or low back is a good sign that you should stop exercising. Check with your health care provider before continuing normal exercises. °· Try to avoid standing for long  periods of time. Move your legs often if you must stand in one place for a long time. °· Avoid heavy lifting. °· Wear low-heeled shoes, and practice good posture. °· You may continue to have sex unless your health care provider directs you otherwise. °Relief of Pain or Discomfort °· Wear a good support bra for breast tenderness.   °· Take warm sitz baths to soothe any pain or discomfort caused by hemorrhoids. Use hemorrhoid cream if your health care provider approves.   °· Rest with your legs elevated if you have leg cramps or low back pain. °· If you develop varicose veins in your legs, wear support hose. Elevate your feet for 15 minutes, 3-4 times a day. Limit salt in your diet. °Prenatal Care °· Schedule your prenatal visits by the twelfth week of pregnancy. They are usually scheduled monthly at first, then more often in the last 2 months before delivery. °· Write down your questions. Take them to your prenatal visits. °· Keep all your prenatal visits as directed by your   health care provider. °Safety °· Wear your seat belt at all times when driving. °· Make a list of emergency phone numbers, including numbers for family, friends, the hospital, and police and fire departments. °General Tips °· Ask your health care provider for a referral to a local prenatal education class. Begin classes no later than at the beginning of month 6 of your pregnancy. °· Ask for help if you have counseling or nutritional needs during pregnancy. Your health care provider can offer advice or refer you to specialists for help with various needs. °· Do not use hot tubs, steam rooms, or saunas. °· Do not douche or use tampons or scented sanitary pads. °· Do not cross your legs for long periods of time. °· Avoid cat litter boxes and soil used by cats. These carry germs that can cause birth defects in the baby and possibly loss of the fetus by miscarriage or stillbirth. °· Avoid all smoking, herbs, alcohol, and medicines not prescribed by  your health care provider. Chemicals in these affect the formation and growth of the baby. °· Do not use any tobacco products, including cigarettes, chewing tobacco, and electronic cigarettes. If you need help quitting, ask your health care provider. You may receive counseling support and other resources to help you quit. °· Schedule a dentist appointment. At home, brush your teeth with a soft toothbrush and be gentle when you floss. °SEEK MEDICAL CARE IF:  °· You have dizziness. °· You have mild pelvic cramps, pelvic pressure, or nagging pain in the abdominal area. °· You have persistent nausea, vomiting, or diarrhea. °· You have a bad smelling vaginal discharge. °· You have pain with urination. °· You notice increased swelling in your face, hands, legs, or ankles. °SEEK IMMEDIATE MEDICAL CARE IF:  °· You have a fever. °· You are leaking fluid from your vagina. °· You have spotting or bleeding from your vagina. °· You have severe abdominal cramping or pain. °· You have rapid weight gain or loss. °· You vomit blood or material that looks like coffee grounds. °· You are exposed to German measles and have never had them. °· You are exposed to fifth disease or chickenpox. °· You develop a severe headache. °· You have shortness of breath. °· You have any kind of trauma, such as from a fall or a car accident. °  °This information is not intended to replace advice given to you by your health care provider. Make sure you discuss any questions you have with your health care provider. °  °Document Released: 12/30/2000 Document Revised: 01/26/2014 Document Reviewed: 11/15/2012 °Elsevier Interactive Patient Education ©2016 [employer] °Constipation, Adult °Constipation is when a person has fewer than three bowel movements a week, has difficulty having a bowel movement, or has stools that are dry, hard, or larger than normal. As people grow older, constipation is more common. A low-fiber diet, not taking in enough fluids, and  taking certain medicines may make constipation worse.  °CAUSES  °· Certain medicines, such as antidepressants, pain medicine, iron supplements, antacids, and water pills.   °· Certain diseases, such as diabetes, irritable bowel syndrome (IBS), thyroid disease, or depression.   °· Not drinking enough water.   °· Not eating enough fiber-rich foods.   °· Stress or travel.   °· Lack of physical activity or exercise.   °· Ignoring the urge to have a bowel movement.   °· Using laxatives too much.   °SIGNS AND SYMPTOMS  °· Having fewer than three bowel movements a week.   °· Straining   to have a bowel movement.   °· Having stools that are hard, dry, or larger than normal.   °· Feeling full or bloated.   °· Pain in the lower abdomen.   °· Not feeling relief after having a bowel movement.   °DIAGNOSIS  °Your health care provider will take a medical history and perform a physical exam. Further testing may be done for severe constipation. Some tests may include: °· A barium enema X-ray to examine your rectum, colon, and, sometimes, your small intestine.   °· A sigmoidoscopy to examine your lower colon.   °· A colonoscopy to examine your entire colon. °TREATMENT  °Treatment will depend on the severity of your constipation and what is causing it. Some dietary treatments include drinking more fluids and eating more fiber-rich foods. Lifestyle treatments may include regular exercise. If these diet and lifestyle recommendations do not help, your health care provider may recommend taking over-the-counter laxative medicines to help you have bowel movements. Prescription medicines may be prescribed if over-the-counter medicines do not work.  °HOME CARE INSTRUCTIONS  °· Eat foods that have a lot of fiber, such as fruits, vegetables, whole grains, and beans. °· Limit foods high in fat and processed sugars, such as french fries, hamburgers, cookies, candies, and soda.   °· A fiber supplement may be added to your diet if you cannot get  enough fiber from foods.   °· Drink enough fluids to keep your urine clear or pale yellow.   °· Exercise regularly or as directed by your health care provider.   °· Go to the restroom when you have the urge to go. Do not hold it.   °· Only take over-the-counter or prescription medicines as directed by your health care provider. Do not take other medicines for constipation without talking to your health care provider first.   °SEEK IMMEDIATE MEDICAL CARE IF:  °· You have bright red blood in your stool.   °· Your constipation lasts for more than 4 days or gets worse.   °· You have abdominal or rectal pain.   °· You have thin, pencil-like stools.   °· You have unexplained weight loss. °MAKE SURE YOU:  °· Understand these instructions. °· Will watch your condition. °· Will get help right away if you are not doing well or get worse. °  °This information is not intended to replace advice given to you by your health care provider. Make sure you discuss any questions you have with your health care provider. °  °Document Released: 10/04/2003 Document Revised: 01/26/2014 Document Reviewed: 10/17/2012 °Elsevier Interactive Patient Education ©2016 [employer] ° °

## 2015-04-21 NOTE — MAU Provider Note (Signed)
Chief Complaint: Back Pain and Abdominal Pain   First Provider Initiated Contact with Patient 04/21/15 0024        SUBJECTIVE  HPI: Sonya Foster is a 25 y.o. G4P3003 at 90108w2d by LMP who presents to maternity admissions reporting lower back pain, intermittent colicky abdominal pain "all over", intermittent dizziness, and constipation. Has occasional headaches. States pregnancy test was negative in February but positive in March.  Thinks pain "is all about gas, mostly".  Wants to know how far along she is.   Wants a picture of the baby.  She denies vaginal bleeding, vaginal itching/burning, urinary symptoms, dizziness, n/v, or fever/chills.    Abdominal Pain This is a new problem. The current episode started more than 1 month ago. The onset quality is gradual. The problem occurs intermittently. The problem has been waxing and waning. The pain is located in the generalized abdominal region. The pain is mild. The quality of the pain is cramping and colicky. The abdominal pain radiates to the back. Associated symptoms include constipation and flatus. Pertinent negatives include no anorexia, belching, diarrhea, dysuria, fever, frequency, headaches, myalgias, nausea or vomiting. Nothing aggravates the pain. The pain is relieved by nothing. She has tried nothing for the symptoms.   RN Note:  Expand All Collapse All   Having a lot of lower back pain, upper and lower abd pain. Headaches and dizzy sometimes. SOB sometimes. Pain present for a month. Some of abd pain is gas. Pt states had pos home upt first of March           Past Medical History  Diagnosis Date  . Genital HSV   . History of chlamydia   . Late prenatal care   . Abnormal Pap smear    History reviewed. No pertinent past surgical history. Social History   Social History  . Marital Status: Married    Spouse Name: N/A  . Number of Children: N/A  . Years of Education: N/A   Occupational History  . Not on file.   Social  History Main Topics  . Smoking status: Former Smoker    Quit date: 06/24/2013  . Smokeless tobacco: Not on file  . Alcohol Use: Yes     Comment: occ  . Drug Use: No  . Sexual Activity: Yes    Birth Control/ Protection: None   Other Topics Concern  . Not on file   Social History Narrative   No current facility-administered medications on file prior to encounter.   Current Outpatient Prescriptions on File Prior to Encounter  Medication Sig Dispense Refill  . amoxicillin (AMOXIL) 500 MG capsule Take 1 capsule (500 mg total) by mouth 3 (three) times daily. (Patient not taking: Reported on 02/19/2015) 30 capsule 0  . cephALEXin (KEFLEX) 500 MG capsule Take 1 capsule (500 mg total) by mouth 2 (two) times daily. 20 capsule 0  . famotidine (PEPCID) 20 MG tablet Take 1 tablet (20 mg total) by mouth 2 (two) times daily. (Patient not taking: Reported on 02/19/2015) 30 tablet 0  . HYDROcodone-acetaminophen (NORCO/VICODIN) 5-325 MG tablet Take 1 tablet by mouth every 6 (six) hours as needed for severe pain. (Patient not taking: Reported on 02/19/2015) 6 tablet 0  . ibuprofen (ADVIL,MOTRIN) 600 MG tablet Take 1 tablet (600 mg total) by mouth every 6 (six) hours as needed. (Patient not taking: Reported on 02/19/2015) 30 tablet 0  . loratadine (CLARITIN) 10 MG tablet Take 1 tablet (10 mg total) by mouth daily. (Patient not taking: Reported on  02/19/2015) 7 tablet 0   No Known Allergies  I have reviewed patient's Past Medical Hx, Surgical Hx, Family Hx, Social Hx, medications and allergies.   ROS:  Review of Systems  Constitutional: Negative for fever.  Gastrointestinal: Positive for abdominal pain, constipation and flatus. Negative for nausea, vomiting, diarrhea and anorexia.  Genitourinary: Negative for dysuria and frequency.  Musculoskeletal: Negative for myalgias.  Neurological: Negative for headaches.   Other systems negative  Physical Exam  Patient Vitals for the past 24 hrs:  BP Temp Pulse  Resp SpO2 Height Weight  04/20/15 2358 - - - - 100 % - -  04/20/15 2356 108/71 mmHg 98 F (36.7 C) 77 18 -  (1.6 m) 128 lb 12.8 oz (58.423 kg)   Physical Exam  Constitutional: Well-developed, well-nourished female in no acute distress.  Cardiovascular: normal rate and rhythm Respiratory: normal effort, CTAB GI: Abd soft, non-tender. Pos BS x 4 MS: Extremities nontender, no edema, normal ROM Neurologic: Alert and oriented x 4.  GU: Neg CVAT.  PELVIC EXAM: Cervix 0/long/high, firm, anterior, neg CMT, uterus nontender, enlarged 12 wks, adnexa without tenderness, enlargement, or mass  FHT 164 by doppler   LAB RESULTS   Results for orders placed or performed during the hospital encounter of 04/20/15 (from the past 72 hour(s))  Urinalysis, Routine w reflex microscopic (not at [facility])     Status: Abnormal   Collection Time: 04/20/15 11:43 PM  Result Value Ref Range   Color, Urine YELLOW YELLOW   APPearance CLEAR CLEAR   Specific Gravity, Urine 1.020 1.005 - 1.030   pH 6.0 5.0 - 8.0   Glucose, UA NEGATIVE NEGATIVE mg/dL   Hgb urine dipstick NEGATIVE NEGATIVE   Bilirubin Urine NEGATIVE NEGATIVE   Ketones, ur 15 (A) NEGATIVE mg/dL   Protein, ur NEGATIVE NEGATIVE mg/dL   Nitrite NEGATIVE NEGATIVE   Leukocytes, UA NEGATIVE NEGATIVE    Comment: MICROSCOPIC NOT DONE ON URINES WITH NEGATIVE PROTEIN, BLOOD, LEUKOCYTES, NITRITE, OR GLUCOSE <1000 mg/dL.  Pregnancy, urine POC     Status: Abnormal   Collection Time: 04/21/15 12:01 AM  Result Value Ref Range   Preg Test, Ur POSITIVE (A) NEGATIVE    Comment:        THE SENSITIVITY OF THIS METHODOLOGY IS >24 mIU/mL   CBC     Status: Abnormal   Collection Time: 04/21/15 12:41 AM  Result Value Ref Range   WBC 6.7 4.0 - 10.5 K/uL   RBC 4.33 3.87 - 5.11 MIL/uL   Hemoglobin 12.8 12.0 - 15.0 g/dL   HCT 16.1 (L) 09.6 - 04.5 %   MCV 82.0 78.0 - 100.0 fL   MCH 29.6 26.0 - 34.0 pg   MCHC 36.1 (H) 30.0 - 36.0 g/dL   RDW 40.9 81.1 - 91.4 %    Platelets 211 150 - 400 K/uL  Wet prep, genital     Status: Abnormal   Collection Time: 04/21/15 12:45 AM  Result Value Ref Range   Yeast Wet Prep HPF POC NONE SEEN NONE SEEN   Trich, Wet Prep NONE SEEN NONE SEEN   Clue Cells Wet Prep HPF POC PRESENT (A) NONE SEEN   WBC, Wet Prep HPF POC NONE SEEN NONE SEEN    Comment: MODERATE BACTERIA SEEN   Sperm NONE SEEN     IMAGING Bedside US done:  Single gestational sac, normal  Single fetus measuring [redacted]w[redacted]d by CRL                                 + movement of fetus                                Regular cardiac activity, 160s  MAU Management/MDM: Ordered labs and reviewed results.  CBC done to rule out infectious processes   Cultures were done to rule out pelvic infection  Pt stable at time of discharge.  ASSESSMENT 1. Abdominal pain affecting pregnancy, antepartum   2.    Constipation, probably causing gas pains 3.    SIUP at [redacted]w[redacted]d by CRL on bedside US  PLAN Discharge home Discussed constipation and associated pain Encouraged early prenatal care, plans care with Dr Jackelyn Knife, probably Rx Miralax for constipation    Medication List    ASK your doctor about these medications        amoxicillin 500 MG capsule  Commonly known as:  AMOXIL  Take 1 capsule (500 mg total) by mouth 3 (three) times daily.     cephALEXin 500 MG capsule  Commonly known as:  KEFLEX  Take 1 capsule (500 mg total) by mouth 2 (two) times daily.     famotidine 20 MG tablet  Commonly known as:  PEPCID  Take 1 tablet (20 mg total) by mouth 2 (two) times daily.     HYDROcodone-acetaminophen 5-325 MG tablet  Commonly known as:  NORCO/VICODIN  Take 1 tablet by mouth every 6 (six) hours as needed for severe pain.     ibuprofen 600 MG tablet  Commonly known as:  ADVIL,MOTRIN  Take 1 tablet (600 mg total) by mouth every 6 (six) hours as needed.     loratadine 10 MG tablet  Commonly known as:  CLARITIN  Take 1 tablet (10 mg  total) by mouth daily.         Encouraged to return here or to other Urgent Care/ED if she develops worsening of symptoms, increase in pain, fever, or other concerning symptoms.    Wynelle Bourgeois CNM, MSN Certified Nurse-Midwife 04/21/2015  12:24 AM

## 2015-04-22 LAB — GC/CHLAMYDIA PROBE AMP (~~LOC~~) NOT AT ARMC
CHLAMYDIA, DNA PROBE: NEGATIVE
NEISSERIA GONORRHEA: NEGATIVE

## 2015-05-20 ENCOUNTER — Inpatient Hospital Stay (HOSPITAL_COMMUNITY)
Admission: AD | Admit: 2015-05-20 | Discharge: 2015-05-20 | Disposition: A | Discharge status: Home / Self Care | Payer: Medicaid Other | Source: Ambulatory Visit | Attending: Family Medicine | Admitting: Family Medicine

## 2015-05-20 ENCOUNTER — Encounter (HOSPITAL_COMMUNITY): Payer: Self-pay | Admitting: *Deleted

## 2015-05-20 DIAGNOSIS — O26892 Other specified pregnancy related conditions, second trimester: Secondary | ICD-10-CM | POA: Insufficient documentation

## 2015-05-20 DIAGNOSIS — Z3A15 15 weeks gestation of pregnancy: Secondary | ICD-10-CM | POA: Diagnosis not present

## 2015-05-20 DIAGNOSIS — R197 Diarrhea, unspecified: Secondary | ICD-10-CM | POA: Diagnosis not present

## 2015-05-20 DIAGNOSIS — A499 Bacterial infection, unspecified: Secondary | ICD-10-CM

## 2015-05-20 DIAGNOSIS — Z3A16 16 weeks gestation of pregnancy: Secondary | ICD-10-CM

## 2015-05-20 DIAGNOSIS — Z87891 Personal history of nicotine dependence: Secondary | ICD-10-CM | POA: Insufficient documentation

## 2015-05-20 DIAGNOSIS — O26899 Other specified pregnancy related conditions, unspecified trimester: Secondary | ICD-10-CM

## 2015-05-20 DIAGNOSIS — N760 Acute vaginitis: Secondary | ICD-10-CM | POA: Insufficient documentation

## 2015-05-20 DIAGNOSIS — O9989 Other specified diseases and conditions complicating pregnancy, childbirth and the puerperium: Secondary | ICD-10-CM | POA: Diagnosis not present

## 2015-05-20 DIAGNOSIS — B9689 Other specified bacterial agents as the cause of diseases classified elsewhere: Secondary | ICD-10-CM

## 2015-05-20 DIAGNOSIS — O98812 Other maternal infectious and parasitic diseases complicating pregnancy, second trimester: Secondary | ICD-10-CM | POA: Insufficient documentation

## 2015-05-20 DIAGNOSIS — O23592 Infection of other part of genital tract in pregnancy, second trimester: Secondary | ICD-10-CM

## 2015-05-20 DIAGNOSIS — R109 Unspecified abdominal pain: Secondary | ICD-10-CM | POA: Diagnosis not present

## 2015-05-20 DIAGNOSIS — Z760 Encounter for issue of repeat prescription: Secondary | ICD-10-CM

## 2015-05-20 DIAGNOSIS — R112 Nausea with vomiting, unspecified: Secondary | ICD-10-CM | POA: Diagnosis present

## 2015-05-20 LAB — CBC
HEMATOCRIT: 32.7 % — AB (ref 36.0–46.0)
HEMOGLOBIN: 11.6 g/dL — AB (ref 12.0–15.0)
MCH: 29.3 pg (ref 26.0–34.0)
MCHC: 35.5 g/dL (ref 30.0–36.0)
MCV: 82.6 fL (ref 78.0–100.0)
Platelets: 156 10*3/uL (ref 150–400)
RBC: 3.96 MIL/uL (ref 3.87–5.11)
RDW: 13.7 % (ref 11.5–15.5)
WBC: 5.3 10*3/uL (ref 4.0–10.5)

## 2015-05-20 LAB — WET PREP, GENITAL
SPERM: NONE SEEN
Trich, Wet Prep: NONE SEEN
Yeast Wet Prep HPF POC: NONE SEEN

## 2015-05-20 LAB — URINALYSIS, ROUTINE W REFLEX MICROSCOPIC
Bilirubin Urine: NEGATIVE
Glucose, UA: NEGATIVE mg/dL
Hgb urine dipstick: NEGATIVE
Ketones, ur: NEGATIVE mg/dL
Leukocytes, UA: NEGATIVE
NITRITE: NEGATIVE
PH: 5.5 (ref 5.0–8.0)
Protein, ur: NEGATIVE mg/dL
SPECIFIC GRAVITY, URINE: 1.01 (ref 1.005–1.030)

## 2015-05-20 MED ORDER — ACETAMINOPHEN 500 MG PO TABS
1000.0000 mg | ORAL_TABLET | Freq: Once | ORAL | Status: AC
  Administered 2015-05-20: 1000 mg via ORAL
  Filled 2015-05-20: qty 2

## 2015-05-20 MED ORDER — METRONIDAZOLE 500 MG PO TABS: 500.0000 mg | ORAL_TABLET | Freq: Two times a day (BID) | ORAL | Status: DC

## 2015-05-20 NOTE — Discharge Instructions (Signed)
°Bacterial Vaginosis °Bacterial vaginosis is an infection of the vagina. It happens when too many germs (bacteria) grow in the vagina. Having this infection puts you at risk for getting other infections from sex. Treating this infection can help lower your risk for other infections, such as:  °· Chlamydia. °· Gonorrhea. °· HIV. °· Herpes. °HOME CARE °· Take your medicine as told by your doctor. °· Finish your medicine even if you start to feel better. °· Tell your sex partner that you have an infection. They should see their doctor for treatment. °· During treatment: °¨ Avoid sex or use condoms correctly. °¨ Do not douche. °¨ Do not drink alcohol unless your doctor tells you it is ok. °¨ Do not breastfeed unless your doctor tells you it is ok. °GET HELP IF: °· You are not getting better after 3 days of treatment. °· You have more grey fluid (discharge) coming from your vagina than before. °· You have more pain than before. °· You have a fever. °MAKE SURE YOU:  °· Understand these instructions. °· Will watch your condition. °· Will get help right away if you are not doing well or get worse. °  °This information is not intended to replace advice given to you by your health care provider. Make sure you discuss any questions you have with your health care provider. °  °Document Released: 10/15/2007 Document Revised: 01/26/2014 Document Reviewed: 08/17/2012 °Elsevier Interactive Patient Education ©2016 [employer] Inc[employer] °Abdominal Pain During Pregnancy °Abdominal pain is common in pregnancy. Most of the time, it does not cause harm. There are many causes of abdominal pain. Some causes are more serious than others. Some of the causes of abdominal pain in pregnancy are easily diagnosed. Occasionally, the diagnosis takes time to understand. Other times, the cause is not determined. Abdominal pain can be a sign that something is very wrong with the pregnancy, or the pain may have nothing to do with the pregnancy at all. For  this reason, always tell your health care provider if you have any abdominal discomfort. °HOME CARE INSTRUCTIONS  °Monitor your abdominal pain for any changes. The following actions may help to alleviate any discomfort you are experiencing: °· Do not have sexual intercourse or put anything in your vagina until your symptoms go away completely. °· Get plenty of rest until your pain improves. °· Drink clear fluids if you feel nauseous. Avoid solid food as long as you are uncomfortable or nauseous. °· Only take over-the-counter or prescription medicine as directed by your health care provider. °· Keep all follow-up appointments with your health care provider. °SEEK IMMEDIATE MEDICAL CARE IF: °· You are bleeding, leaking fluid, or passing tissue from the vagina. °· You have increasing pain or cramping. °· You have persistent vomiting. °· You have painful or bloody urination. °· You have a fever. °· You notice a decrease in your baby's movements. °· You have extreme weakness or feel faint. °· You have shortness of breath, with or without abdominal pain. °· You develop a severe headache with abdominal pain. °· You have abnormal vaginal discharge with abdominal pain. °· You have persistent diarrhea. °· You have abdominal pain that continues even after rest, or gets worse. °MAKE SURE YOU:  °· Understand these instructions. °· Will watch your condition. °· Will get help right away if you are not doing well or get worse. °  °This information is not intended to replace advice given to you by your health care provider. Make sure you discuss   any questions you have with your health care provider. °  °Document Released: 01/05/2005 Document Revised: 10/26/2012 Document Reviewed: 08/04/2012 °Elsevier Interactive Patient Education ©2016 Elsevier Inc. ° °

## 2015-05-20 NOTE — MAU Note (Signed)
Pt abdominal pain on right side and lower back since last night. Pt had intercourse prior to. Pt having diahrrea since Saturday night. Vomiting Saturday and Sunday, None today. Pt denies bleeding or discharge.

## 2015-05-20 NOTE — MAU Provider Note (Signed)
History     CSN: [id number]  Arrival date and time: 05/20/15 1054   First Provider Initiated Contact with Patient 05/20/15 1412      No chief complaint on file.  HPI   Ms.Sonya Foster is 25 y.o. female [phone] at [redacted]w[redacted]d presenting to MAU with N,V,D.  The diarrhea started Sunday; she had two episodes yesterday, two this morning and two episodes since she has been here.  She has not taken anything for the diarrhea. No one else in her house is sick, however she has two young kids in school.   She had intercourse yesterday and since then has noticed a sharp pain on the right side. The pain comes and goes, She has not taken anything for the pain. The pain radiates to both sides of her lower back.   Denies vaginal bleeding.   Patient is requesting an Ultrasound today to "see the baby".    OB History    Gravida Para Term Preterm AB TAB SAB Ectopic Multiple Living   Past Medical History  Diagnosis Date  . Genital HSV   . History of chlamydia   . Late prenatal care   . Abnormal Pap smear     History reviewed. No pertinent past surgical history.  Family History  Problem Relation Age of Onset  . Diabetes Maternal Grandmother   . Hypertension Maternal Grandmother   . Diabetes Maternal Grandfather   . Hypertension Maternal Grandfather     Social History  Substance Use Topics  . Smoking status: Former Smoker    Quit date: 06/24/2013  . Smokeless tobacco: None  . Alcohol Use: Yes     Comment: occ    Allergies: No Known Allergies  Prescriptions prior to admission  Medication Sig Dispense Refill Last Dose  . polyethylene glycol (MIRALAX) packet Take 17 g by mouth daily. (Patient not taking: Reported on 05/20/2015) 14 each 0 Not Taking at Unknown time  . [DISCONTINUED] famotidine (PEPCID) 20 MG tablet Take 1 tablet (20 mg total) by mouth 2 (two) times daily. (Patient not taking: Reported on 02/19/2015) 30 tablet 0 Not Taking at Unknown time  .  [DISCONTINUED] loratadine (CLARITIN) 10 MG tablet Take 1 tablet (10 mg total) by mouth daily. (Patient not taking: Reported on 02/19/2015) 7 tablet 0 Not Taking at Unknown time   Results for orders placed or performed during the hospital encounter of 05/20/15 (from the past 48 hour(s))  Urinalysis, Routine w reflex microscopic (not at [facility])     Status: None   Collection Time: 05/20/15  1:00 PM  Result Value Ref Range   Color, Urine YELLOW YELLOW   APPearance CLEAR CLEAR   Specific Gravity, Urine 1.010 1.005 - 1.030   pH 5.5 5.0 - 8.0   Glucose, UA NEGATIVE NEGATIVE mg/dL   Hgb urine dipstick NEGATIVE NEGATIVE   Bilirubin Urine NEGATIVE NEGATIVE   Ketones, ur NEGATIVE NEGATIVE mg/dL   Protein, ur NEGATIVE NEGATIVE mg/dL   Nitrite NEGATIVE NEGATIVE   Leukocytes, UA NEGATIVE NEGATIVE    Comment: MICROSCOPIC NOT DONE ON URINES WITH NEGATIVE PROTEIN, BLOOD, LEUKOCYTES, NITRITE, OR GLUCOSE <1000 mg/dL.  Wet prep, genital     Status: Abnormal   Collection Time: 05/20/15  2:20 PM  Result Value Ref Range   Yeast Wet Prep HPF POC NONE SEEN NONE SEEN   Trich, Wet Prep NONE SEEN NONE SEEN   Clue Cells Wet Prep HPF POC  PRESENT (A) NONE SEEN   WBC, Wet Prep HPF POC MODERATE (A) NONE SEEN    Comment: BACTERIA- TOO NUMEROUS TO COUNT   Sperm NONE SEEN   CBC     Status: Abnormal   Collection Time: 05/20/15  2:36 PM  Result Value Ref Range   WBC 5.3 4.0 - 10.5 K/uL   RBC 3.96 3.87 - 5.11 MIL/uL   Hemoglobin 11.6 (L) 12.0 - 15.0 g/dL   HCT 40.932.7 (L) 81.136.0 - 91.446.0 %   MCV 82.6 78.0 - 100.0 fL   MCH 29.3 26.0 - 34.0 pg   MCHC 35.5 30.0 - 36.0 g/dL   RDW 78.213.7 95.611.5 - 21.315.5 %   Platelets 156 150 - 400 K/uL    Review of Systems  Constitutional: Negative for fever.  Gastrointestinal: Positive for abdominal pain and diarrhea. Negative for nausea and vomiting.  Genitourinary: Negative for dysuria and urgency.   Physical Exam   Blood pressure 117/65, pulse 81, temperature 98.1 F (36.7 C), temperature  source Oral, resp. rate 16, height 5\' 3"  (1.6 m), weight 137 lb 6.4 oz (62.324 kg), last menstrual period 02/08/2015, SpO2 99 %.  Physical Exam  Constitutional: She is oriented to person, place, and time. She appears well-developed and well-nourished. No distress.  HENT:  Head: Normocephalic.  Eyes: Pupils are equal, round, and reactive to light.  Respiratory: Effort normal and breath sounds normal.  GI: Soft. She exhibits no distension and no mass. There is no tenderness. There is no rebound and no guarding.  Genitourinary:  Wet prep collected without speculum Bimanual exam: cervix closed, thick. No CMT. No adnexal tenderness   Musculoskeletal: Normal range of motion.  Neurological: She is alert and oriented to person, place, and time.  Skin: Skin is warm. She is not diaphoretic.  Psychiatric: Her behavior is normal.    MAU Course  Procedures  None  MDM  CBC UA Wet prep + fetal heart tones   Assessment and Plan    A:   1. Abdominal pain in pregnancy   2. BV (bacterial vaginosis)   3. Diarrhea, unspecified type     P:  Discharge home in stable condition Ok to take imodium as directed on the bottle  [facility]; message sent for referral. Contact information given to the patient.  RX: Flagyl  Return to MAU if symptoms worsen  Start prenatal care ASAP.    Duane LopeJennifer I Xaine Sansom, NP  05/20/2015 5:01 PM

## 2015-05-23 ENCOUNTER — Telehealth: Payer: Self-pay | Admitting: Family Medicine

## 2015-05-23 NOTE — Telephone Encounter (Signed)
Spoke with her husband, left message for her to call us back in the Clinics. Husband stated she was at her Dr. AstronomerAppointment. Asked if he could have her to give us a call.

## 2015-05-31 ENCOUNTER — Encounter: Payer: Medicaid Other | Admitting: Family Medicine

## 2015-05-31 ENCOUNTER — Encounter: Payer: Self-pay | Admitting: Family Medicine

## 2015-07-02 ENCOUNTER — Encounter: Payer: Self-pay | Admitting: Certified Nurse Midwife

## 2015-07-04 ENCOUNTER — Inpatient Hospital Stay (HOSPITAL_COMMUNITY)
Admission: AD | Admit: 2015-07-04 | Discharge: 2015-07-04 | Disposition: A | Discharge status: Home / Self Care | Payer: Medicaid Other | Source: Ambulatory Visit | Attending: Obstetrics | Admitting: Obstetrics

## 2015-07-04 DIAGNOSIS — O368120 Decreased fetal movements, second trimester, not applicable or unspecified: Secondary | ICD-10-CM | POA: Insufficient documentation

## 2015-07-04 DIAGNOSIS — Z3A22 22 weeks gestation of pregnancy: Secondary | ICD-10-CM | POA: Insufficient documentation

## 2015-07-04 DIAGNOSIS — Z87891 Personal history of nicotine dependence: Secondary | ICD-10-CM | POA: Insufficient documentation

## 2015-07-04 DIAGNOSIS — O4692 Antepartum hemorrhage, unspecified, second trimester: Secondary | ICD-10-CM | POA: Diagnosis not present

## 2015-07-04 DIAGNOSIS — O479 False labor, unspecified: Secondary | ICD-10-CM

## 2015-07-04 LAB — WET PREP, GENITAL
Clue Cells Wet Prep HPF POC: NONE SEEN
SPERM: NONE SEEN
TRICH WET PREP: NONE SEEN
Yeast Wet Prep HPF POC: NONE SEEN

## 2015-07-04 LAB — URINALYSIS, ROUTINE W REFLEX MICROSCOPIC
BILIRUBIN URINE: NEGATIVE
Glucose, UA: 100 mg/dL — AB
KETONES UR: NEGATIVE mg/dL
Leukocytes, UA: NEGATIVE
NITRITE: NEGATIVE
PH: 5.5 (ref 5.0–8.0)
Protein, ur: NEGATIVE mg/dL
Specific Gravity, Urine: 1.015 (ref 1.005–1.030)

## 2015-07-04 LAB — URINE MICROSCOPIC-ADD ON

## 2015-07-04 MED ORDER — ACETAMINOPHEN 325 MG PO TABS: 650.0000 mg | ORAL_TABLET | Freq: Four times a day (QID) | ORAL | Status: DC | PRN

## 2015-07-04 MED ORDER — CYCLOBENZAPRINE HCL 10 MG PO TABS: 10.0000 mg | ORAL_TABLET | Freq: Three times a day (TID) | ORAL | Status: DC | PRN

## 2015-07-04 MED ORDER — CYCLOBENZAPRINE HCL 10 MG PO TABS
10.0000 mg | ORAL_TABLET | Freq: Once | ORAL | Status: AC
  Administered 2015-07-04: 10 mg via ORAL
  Filled 2015-07-04: qty 1

## 2015-07-04 MED ORDER — ACETAMINOPHEN 325 MG PO TABS
650.0000 mg | ORAL_TABLET | Freq: Four times a day (QID) | ORAL | Status: DC | PRN
  Administered 2015-07-04: 650 mg via ORAL
  Filled 2015-07-04: qty 2

## 2015-07-04 NOTE — MAU Provider Note (Signed)
MAU HISTORY AND PHYSICAL  Chief Complaint:  Vaginal Bleeding; Decreased Fetal Movement; and Abdominal Pain   Sonya Foster is a 25 y.o.  [phone] with IUP at [redacted]w[redacted]d presenting for Vaginal Bleeding; Decreased Fetal Movement; and Abdominal Pain  She reports having sharp pelvic pain since 3 am this morning. Pain back to back every 2 minutes. Also periumbilical pain as well. She noted some blood spots on her underwear this morning. She denies profuse bleeding, gush or leak of fluid, vaginal discharge. She denies dysuria, fever or chills. She reports decreased fetal movement since this morning until she got here. She says the baby is moving normal now. She denies trauma, new medication. She denies smoking, drinking or recreational drug use.   She says she weill be starting her [facility] tomorrow. She has an [place] at 11 week but no formal PNC yet.    Past Medical History  Diagnosis Date  . Genital HSV   . History of chlamydia   . Late prenatal care   . Abnormal Pap smear     No past surgical history on file.  Family History  Problem Relation Age of Onset  . Diabetes Maternal Grandmother   . Hypertension Maternal Grandmother   . Diabetes Maternal Grandfather   . Hypertension Maternal Grandfather     Social History  Substance Use Topics  . Smoking status: Former Smoker    Quit date: 06/24/2013  . Smokeless tobacco: Not on file  . Alcohol Use: Yes     Comment: occ    No Known Allergies  Prescriptions prior to admission  Medication Sig Dispense Refill Last Dose  . Prenatal Vit-Fe Fumarate-FA (PRENATAL MULTIVITAMIN) TABS tablet Take 1 tablet by mouth daily at 12 noon.   07/03/2015 at Unknown time  . metroNIDAZOLE (FLAGYL) 500 MG tablet Take 1 tablet (500 mg total) by mouth 2 (two) times daily. (Patient not taking: Reported on 07/04/2015) 14 tablet 0 Completed Course at Unknown time  . polyethylene glycol (MIRALAX) packet Take 17 g by mouth daily. (Patient not taking: Reported on  05/20/2015) 14 each 0 Not Taking at Unknown time    Review of Systems - Negative except for what is mentioned in HPI.  Physical Exam  Blood pressure 118/57, pulse 98, temperature 98.5 F (36.9 C), temperature source Oral, resp. rate 18, last menstrual period 02/08/2015. GENERAL: Well-developed, well-nourished female in some distress from pain LUNGS: Clear to auscultation bilaterally.  HEART: Regular rate and rhythm. ABDOMEN: Soft, mildly tender to palpation, nondistended, gravid.  EXTREMITIES: Nontender, no edema, 2+ distal pulses. Speculum exam: no vaginal bleeding, fluid pool or abnormal discharge Cervical Exam: cervix closed and thick  FHT: normal in triage @ 163   Labs: Results for orders placed or performed during the hospital encounter of 07/04/15 (from the past 24 hour(s))  Urinalysis, Routine w reflex microscopic (not at [facility])   Collection Time: 07/04/15 11:10 AM  Result Value Ref Range   Color, Urine YELLOW YELLOW   APPearance CLEAR CLEAR   Specific Gravity, Urine 1.015 1.005 - 1.030   pH 5.5 5.0 - 8.0   Glucose, UA 100 (A) NEGATIVE mg/dL   Hgb urine dipstick TRACE (A) NEGATIVE   Bilirubin Urine NEGATIVE NEGATIVE   Ketones, ur NEGATIVE NEGATIVE mg/dL   Protein, ur NEGATIVE NEGATIVE mg/dL   Nitrite NEGATIVE NEGATIVE   Leukocytes, UA NEGATIVE NEGATIVE  Urine microscopic-add on   Collection Time: 07/04/15 11:10 AM  Result Value Ref Range   Squamous Epithelial / LPF 0-5 (  A) NONE SEEN   WBC, UA 0-5 0 - 5 WBC/hpf   RBC / HPF 0-5 0 - 5 RBC/hpf   Bacteria, UA RARE (A) NONE SEEN  Wet prep, genital   Collection Time: 07/04/15 12:00 PM  Result Value Ref Range   Yeast Wet Prep HPF POC NONE SEEN NONE SEEN   Trich, Wet Prep NONE SEEN NONE SEEN   Clue Cells Wet Prep HPF POC NONE SEEN NONE SEEN   WBC, Wet Prep HPF POC MODERATE (A) NONE SEEN   Sperm NONE SEEN     Imaging Studies:  No results found.  Assessment: Sonya Foster is  25 y.o. [phone] at 2571w1d presents with  Vaginal Bleeding,Decreased Fetal Movement and Abdominal Pain since 3 am this morning. UA and wet prep negative. Fetal heart tone present on US in triage. She now feels the baby moving as usual. There is no bleeding noted on speculum exam and cervical exam. Cervix is closed as well. Gave her flexeril and tylenol here.    Plan: Discharge home  Flexeril 10 mg three times a day as needed pain Tylenol 650 mg four times a day as needed pain GC/CT pending [facility] Discussed return precautions  Almon Herculesaye T Gonfa 6/15/201712:37 PM   I have seen this patient and agree with the above resident's note.  LEFTWICH-KIRBY, Janera Peugh Certified Nurse-Midwife

## 2015-07-04 NOTE — MAU Note (Signed)
Pt received to MAU rm 10 c/o mid abdominal pain around the umbilicus. Patient rates pain 8/10. Pt states pain started around 0300 this morning with dark brown vaginal spotting. Pt states that all events happened after sexual intercourse. Abdomen soft on palpation. No vaginal bleeding visualized. Will contact physician to evaluate. Carmelina DaneERRI L Ranee Peasley , RN

## 2015-07-04 NOTE — Discharge Instructions (Signed)
Braxton Hicks Contractions °Contractions of the uterus can occur throughout pregnancy. Contractions are not always a sign that you are in labor.  °WHAT ARE BRAXTON HICKS CONTRACTIONS?  °Contractions that occur before labor are called Braxton Hicks contractions, or false labor. Toward the end of pregnancy (32-34 weeks), these contractions can develop more often and may become more forceful. This is not true labor because these contractions do not result in opening (dilatation) and thinning of the cervix. They are sometimes difficult to tell apart from true labor because these contractions can be forceful and people have different pain tolerances. You should not feel embarrassed if you go to the hospital with false labor. Sometimes, the only way to tell if you are in true labor is for your health care provider to look for changes in the cervix. °If there are no prenatal problems or other health problems associated with the pregnancy, it is completely safe to be sent home with false labor and await the onset of true labor. °HOW CAN YOU TELL THE DIFFERENCE BETWEEN TRUE AND FALSE LABOR? °False Labor °· The contractions of false labor are usually shorter and not as hard as those of true labor.   °· The contractions are usually irregular.   °· The contractions are often felt in the front of the lower abdomen and in the groin.   °· The contractions may go away when you walk around or change positions while lying down.   °· The contractions get weaker and are shorter lasting as time goes on.   °· The contractions do not usually become progressively stronger, regular, and closer together as with true labor.   °True Labor °· Contractions in true labor last 30-70 seconds, become very regular, usually become more intense, and increase in frequency.   °· The contractions do not go away with walking.   °· The discomfort is usually felt in the top of the uterus and spreads to the lower abdomen and low back.   °· True labor can be  determined by your health care provider with an exam. This will show that the cervix is dilating and getting thinner.   °WHAT TO REMEMBER °· Keep up with your usual exercises and follow other instructions given by your health care provider.   °· Take medicines as directed by your health care provider.   °· Keep your regular prenatal appointments.   °· Eat and drink lightly if you think you are going into labor.   °· If Braxton Hicks contractions are making you uncomfortable:   °¨ Change your position from lying down or resting to walking, or from walking to resting.   °¨ Sit and rest in a tub of warm water.   °¨ Drink 2-3 glasses of water. Dehydration may cause these contractions.   °¨ Do slow and deep breathing several times an hour.   °WHEN SHOULD I SEEK IMMEDIATE MEDICAL CARE? °Seek immediate medical care if: °· Your contractions become stronger, more regular, and closer together.   °· You have fluid leaking or gushing from your vagina.   °· You have a fever.   °· You pass blood-tinged mucus.   °· You have vaginal bleeding.   °· You have continuous abdominal pain.   °· You have low back pain that you never had before.   °· You feel your baby's head pushing down and causing pelvic pressure.   °· Your baby is not moving as much as it used to.   °  °This information is not intended to replace advice given to you by your health care provider. Make sure you discuss any questions you have with your health care   provider. °  °Document Released: 01/05/2005 Document Revised: 01/10/2013 Document Reviewed: 10/17/2012 °Elsevier Interactive Patient Education ©2016 Elsevier Inc. ° °

## 2015-07-04 NOTE — MAU Note (Signed)
Pt C/O spotting since last night, hasn't felt baby move since then.  Slight lower abd pain.

## 2015-07-05 ENCOUNTER — Ambulatory Visit (INDEPENDENT_AMBULATORY_CARE_PROVIDER_SITE_OTHER): Payer: Medicaid Other | Admitting: Certified Nurse Midwife

## 2015-07-05 VITALS — BP 108/67 | HR 101 | Temp 98.7°F | Wt 152.0 lb

## 2015-07-05 DIAGNOSIS — Z3482 Encounter for supervision of other normal pregnancy, second trimester: Secondary | ICD-10-CM

## 2015-07-05 LAB — POCT URINALYSIS DIPSTICK
Bilirubin, UA: NEGATIVE
Blood, UA: NEGATIVE
Glucose, UA: NEGATIVE
Ketones, UA: NEGATIVE
Leukocytes, UA: NEGATIVE
Nitrite, UA: NEGATIVE
Spec Grav, UA: 1.02
Urobilinogen, UA: NEGATIVE
pH, UA: 5

## 2015-07-05 LAB — GC/CHLAMYDIA PROBE AMP (~~LOC~~) NOT AT ARMC
Chlamydia: NEGATIVE
Neisseria Gonorrhea: NEGATIVE

## 2015-07-05 MED ORDER — VITAFOL GUMMIES 3.33-0.333-34.8 MG PO CHEW: 3.0000 | CHEWABLE_TABLET | Freq: Every day | ORAL | Status: DC

## 2015-07-05 NOTE — Progress Notes (Signed)
Subjective:    Sonya Foster is being seen today for her first obstetrical visit.  This is a planned pregnancy. She is at [redacted]w[redacted]d gestation. Her obstetrical history is significant for macrosomia. Relationship with FOB: significant other, living together. Patient does intend to breast feed. Pregnancy history fully reviewed.  The information documented in the HPI was reviewed and verified.  Menstrual History: OB History    Gravida Para Term Preterm AB TAB SAB Ectopic Multiple Living   Menarche age: 25 years of age.    Patient's last menstrual period was 02/08/2015.    Past Medical History  Diagnosis Date  . Genital HSV   . History of chlamydia   . Late prenatal care   . Abnormal Pap smear     No past surgical history on file.   (Not in a hospital admission) No Known Allergies  Social History  Substance Use Topics  . Smoking status: Former Smoker    Quit date: 06/24/2013  . Smokeless tobacco: Not on file  . Alcohol Use: Yes     Comment: occ    Family History  Problem Relation Age of Onset  . Diabetes Maternal Grandmother   . Hypertension Maternal Grandmother   . Diabetes Maternal Grandfather   . Hypertension Maternal Grandfather      Review of Systems Constitutional: negative for weight loss Gastrointestinal: negative for vomiting Genitourinary:negative for genital lesions and vaginal discharge and dysuria Musculoskeletal:negative for back pain Behavioral/Psych: negative for abusive relationship, depression, illegal drug usage and tobacco use    Objective:    LMP 02/08/2015 General Appearance:    Alert, cooperative, no distress, appears stated age  Head:    Normocephalic, without obvious abnormality, atraumatic  Eyes:    PERRL, conjunctiva/corneas clear, EOM's intact, fundi    benign, both eyes  Ears:    Normal TM's and external ear canals, both ears  Nose:   Nares normal, septum midline, mucosa normal, no drainage    or sinus tenderness   Throat:   Lips, mucosa, and tongue normal; teeth and gums normal  Neck:   Supple, symmetrical, trachea midline, no adenopathy;    thyroid:  no enlargement/tenderness/nodules; no carotid   bruit or JVD  Back:     Symmetric, no curvature, ROM normal, no CVA tenderness  Lungs:     Clear to auscultation bilaterally, respirations unlabored  Chest Wall:    No tenderness or deformity   Heart:    Regular rate and rhythm, S1 and S2 normal, no murmur, rub   or gallop  Breast Exam:    No tenderness, masses, or nipple abnormality  Abdomen:     Soft, non-tender, bowel sounds active all four quadrants,    no masses, no organomegaly  Genitalia:    Normal female without lesion, discharge or tenderness  Extremities:   Extremities normal, atraumatic, no cyanosis or edema  Pulses:   2+ and symmetric all extremities  Skin:   Skin color, texture, turgor normal, no rashes or lesions  Lymph nodes:   Cervical, supraclavicular, and axillary nodes normal  Neurologic:   CNII-XII intact, normal strength, sensation and reflexes    throughout     Cervix:  Long, thick, closed and posterior.  FH@22wks , FHR: 155    Lab Review Urine pregnancy test Labs reviewed yes Radiologic studies reviewed no Assessment:    Pregnancy at [redacted]w[redacted]d weeks   H/O HSV Plan:  Prenatal vitamins.  Counseling provided regarding continued use of seat belts, cessation of alcohol consumption, smoking or use of illicit drugs; infection precautions i.e., influenza/TDAP immunizations, toxoplasmosis,CMV, parvovirus, listeria and varicella; workplace safety, exercise during pregnancy; routine dental care, safe medications, sexual activity, hot tubs, saunas, pools, travel, caffeine use, fish and methlymercury, potential toxins, hair treatments, varicose veins Weight gain recommendations per IOM guidelines reviewed: underweight/BMI< 18.5--> gain 28 - 40 lbs; normal weight/BMI 18.5 - 24.9--> gain 25 - 35 lbs; overweight/BMI 25 - 29.9--> gain 15 -  25 lbs; obese/BMI >30->gain  11 - 20 lbs Problem list reviewed and updated. FIRST/CF mutation testing/NIPT/QUAD SCREEN/fragile X/Ashkenazi Jewish population testing/Spinal muscular atrophy discussed: results reviewed. Role of ultrasound in pregnancy discussed; fetal survey: ordered. Amniocentesis discussed: not indicated. VBAC calculator score: VBAC consent form provided No orders of the defined types were placed in this encounter.   No orders of the defined types were placed in this encounter.    Follow up in 4 weeks. 50% of 30 min visit spent on counseling and coordination of care.

## 2015-07-08 ENCOUNTER — Other Ambulatory Visit: Payer: Self-pay | Admitting: Certified Nurse Midwife

## 2015-07-08 ENCOUNTER — Encounter: Payer: Self-pay | Admitting: *Deleted

## 2015-07-08 DIAGNOSIS — B373 Candidiasis of vulva and vagina: Secondary | ICD-10-CM

## 2015-07-08 DIAGNOSIS — B3731 Acute candidiasis of vulva and vagina: Secondary | ICD-10-CM

## 2015-07-08 MED ORDER — FLUCONAZOLE 100 MG PO TABS: 100.0000 mg | ORAL_TABLET | Freq: Once | ORAL | Status: DC

## 2015-07-08 MED ORDER — TERCONAZOLE 0.8 % VA CREA: 1.0000 | TOPICAL_CREAM | Freq: Every day | VAGINAL | Status: DC

## 2015-07-09 ENCOUNTER — Ambulatory Visit (INDEPENDENT_AMBULATORY_CARE_PROVIDER_SITE_OTHER): Payer: Medicaid Other

## 2015-07-09 DIAGNOSIS — Z36 Encounter for antenatal screening of mother: Secondary | ICD-10-CM

## 2015-07-09 DIAGNOSIS — Z3482 Encounter for supervision of other normal pregnancy, second trimester: Secondary | ICD-10-CM

## 2015-07-09 LAB — NUSWAB VG+, CANDIDA 6SP
CANDIDA KRUSEI, NAA: NEGATIVE
CANDIDA TROPICALIS, NAA: NEGATIVE
Candida albicans, NAA: POSITIVE — AB
Candida glabrata, NAA: NEGATIVE
Candida lusitaniae, NAA: NEGATIVE
Candida parapsilosis, NAA: NEGATIVE
Chlamydia trachomatis, NAA: NEGATIVE
NEISSERIA GONORRHOEAE, NAA: NEGATIVE
TRICH VAG BY NAA: NEGATIVE

## 2015-07-09 LAB — PAP IG W/ RFLX HPV ASCU: PAP SMEAR COMMENT: 0

## 2015-07-10 ENCOUNTER — Inpatient Hospital Stay (HOSPITAL_COMMUNITY)
Admission: AD | Admit: 2015-07-10 | Discharge: 2015-07-10 | Disposition: A | Discharge status: Home / Self Care | Payer: Medicaid Other | Source: Ambulatory Visit | Attending: Obstetrics | Admitting: Obstetrics

## 2015-07-10 ENCOUNTER — Encounter (HOSPITAL_COMMUNITY): Payer: Self-pay

## 2015-07-10 ENCOUNTER — Inpatient Hospital Stay (HOSPITAL_COMMUNITY): Payer: Medicaid Other

## 2015-07-10 ENCOUNTER — Other Ambulatory Visit: Payer: Self-pay | Admitting: Certified Nurse Midwife

## 2015-07-10 DIAGNOSIS — O4692 Antepartum hemorrhage, unspecified, second trimester: Secondary | ICD-10-CM

## 2015-07-10 DIAGNOSIS — Z3A23 23 weeks gestation of pregnancy: Secondary | ICD-10-CM | POA: Diagnosis not present

## 2015-07-10 DIAGNOSIS — N939 Abnormal uterine and vaginal bleeding, unspecified: Secondary | ICD-10-CM | POA: Diagnosis present

## 2015-07-10 DIAGNOSIS — Z87891 Personal history of nicotine dependence: Secondary | ICD-10-CM | POA: Insufficient documentation

## 2015-07-10 LAB — URINALYSIS, ROUTINE W REFLEX MICROSCOPIC
BILIRUBIN URINE: NEGATIVE
GLUCOSE, UA: NEGATIVE mg/dL
Ketones, ur: NEGATIVE mg/dL
Leukocytes, UA: NEGATIVE
NITRITE: NEGATIVE
Protein, ur: NEGATIVE mg/dL
SPECIFIC GRAVITY, URINE: 1.02 (ref 1.005–1.030)
pH: 5.5 (ref 5.0–8.0)

## 2015-07-10 LAB — URINE MICROSCOPIC-ADD ON

## 2015-07-10 NOTE — MAU Note (Signed)
Urine in lab 

## 2015-07-10 NOTE — MAU Note (Addendum)
Pt some dark bleeding last week and was in MAU.  Went to the doctor yesterday.   Had bright red bleeding today and called the office and they told her to come to MAU. Not enough bleeding to wear a pad. Came by EMS.

## 2015-07-10 NOTE — Discharge Instructions (Signed)
Vaginal Bleeding During Pregnancy, Second Trimester ° A small amount of bleeding (spotting) from the vagina is common in pregnancy. Sometimes the bleeding is normal and is not a problem, and sometimes it is a sign of something serious. Be sure to tell your doctor about any bleeding from your vagina right away. °HOME CARE °· Watch your condition for any changes. °· Follow your doctor's instructions about how active you can be. °· If you are on bed rest: °¨ You may need to stay in bed and only get up to use the bathroom. °¨ You may be allowed to do some activities. °¨ If you need help, make plans for someone to help you. °· Write down: °¨ The number of pads you use each day. °¨ How often you change pads. °¨ How soaked (saturated) your pads are. °· Do not use tampons. °· Do not douche. °· Do not have sex or orgasms until your doctor says it is okay. °· If you pass any tissue from your vagina, save the tissue so you can show it to your doctor. °· Only take medicines as told by your doctor. °· Do not take aspirin because it can make you bleed. °· Do not exercise, lift heavy weights, or do any activities that take a lot of energy and effort unless your doctor says it is okay. °· Keep all follow-up visits as told by your doctor. °GET HELP IF:  °· You bleed from your vagina. °· You have cramps. °· You have labor pains. °· You have a fever that does not go away after you take medicine. °GET HELP RIGHT AWAY IF: °· You have very bad cramps in your back or belly (abdomen). °· You have contractions. °· You have chills. °· You pass large clots or tissue from your vagina. °· You bleed more. °· You feel light-headed or weak. °· You pass out (faint). °· You are leaking fluid or have a gush of fluid from your vagina. °MAKE SURE YOU: °· Understand these instructions. °· Will watch your condition. °· Will get help right away if you are not doing well or get worse. °  °This information is not intended to replace advice given to you by  your health care provider. Make sure you discuss any questions you have with your health care provider. °  °Document Released: 05/22/2013 Document Reviewed: 05/22/2013 °Elsevier Interactive Patient Education ©2016 Elsevier Inc. ° °

## 2015-07-10 NOTE — MAU Provider Note (Signed)
History     CSN: [id number]  Arrival date and time: 07/10/15 1844   First Provider Initiated Contact with Patient 07/10/15 1921      Chief Complaint  Patient presents with  . Vaginal Bleeding   HPI Sonya Foster is a 25 y.o. G4P3003 at 327w0d who presents via EMS for vaginal bleeding. Called office & was told to call 911. Was seen last week for postcoital bleeding -- states bleeding stopped after that visit. Had anatomy scan in office yesterday (results unavailable to use at this time). Reports bright red vaginal bleeding around 5 pm after having a bowel movement -- did not have to strain. Saw "a lot" of bright red blood on toilet paper twice. Bleeding has decreased since then. Also reports lower abdominal cramping since noticing blood. Rates pain 2/10. Has not treated.  Denies intercourse since last week.  Positive fetal movement.   OB History    Gravida Para Term Preterm AB TAB SAB Ectopic Multiple Living   4 3 3       3       Past Medical History  Diagnosis Date  . Genital HSV   . History of chlamydia   . Late prenatal care   . Abnormal Pap smear     History reviewed. No pertinent past surgical history.  Family History  Problem Relation Age of Onset  . Diabetes Maternal Grandmother   . Hypertension Maternal Grandmother   . Diabetes Maternal Grandfather   . Hypertension Maternal Grandfather     Social History  Substance Use Topics  . Smoking status: Former Smoker    Quit date: 06/24/2013  . Smokeless tobacco: None  . Alcohol Use: Yes     Comment: occ    Allergies: No Known Allergies  Prescriptions prior to admission  Medication Sig Dispense Refill Last Dose  . acetaminophen (TYLENOL) 325 MG tablet Take 2 tablets (650 mg total) by mouth every 6 (six) hours as needed for moderate pain. 30 tablet 0 Taking  . cyclobenzaprine (FLEXERIL) 10 MG tablet Take 1 tablet (10 mg total) by mouth 3 (three) times daily as needed for muscle spasms. 20 tablet 0 Taking  .  fluconazole (DIFLUCAN) 100 MG tablet Take 1 tablet (100 mg total) by mouth once. Repeat dose in 48-72 hour. 3 tablet 0   . Prenatal Vit-Fe Fumarate-FA (PRENATAL MULTIVITAMIN) TABS tablet Take 1 tablet by mouth daily at 12 noon.   Taking  . Prenatal Vit-Fe Phos-FA-Omega (VITAFOL GUMMIES) 3.33-0.333-34.8 MG CHEW Chew 3 tablets by mouth daily. 90 tablet 12   . terconazole (TERAZOL 3) 0.8 % vaginal cream Place 1 applicator vaginally at bedtime. 20 g 0     Review of Systems  Constitutional: Negative.   Gastrointestinal: Positive for abdominal pain. Negative for nausea, vomiting, diarrhea and constipation.  Genitourinary: Negative for dysuria.       + vaginal bleeding   Physical Exam   Blood pressure 107/62, pulse 90, temperature 98.2 F (36.8 C), temperature source Oral, resp. rate 16, height 5\' 3"  (1.6 m), weight 152 lb (68.947 kg), last menstrual period 02/08/2015.  Physical Exam  Nursing note and vitals reviewed. Constitutional: She is oriented to person, place, and time. She appears well-developed and well-nourished. No distress.  HENT:  Head: Normocephalic and atraumatic.  Eyes: Conjunctivae are normal. Right eye exhibits no discharge. Left eye exhibits no discharge. No scleral icterus.  Neck: Normal range of motion.  Cardiovascular: Normal rate, regular rhythm and normal heart sounds.   No murmur heard.  Respiratory: Effort normal and breath sounds normal. No respiratory distress. She has no wheezes.  GI: Soft. There is no tenderness.  Neurological: She is alert and oriented to person, place, and time.  Skin: Skin is warm and dry. She is not diaphoretic.  Psychiatric: She has a normal mood and affect. Her behavior is normal. Judgment and thought content normal.   Fetal Tracing:  Baseline: 145 Variability: moderate Accelerations: 10x10 Decelerations: none  Toco: none  Results for orders placed or performed during the hospital encounter of 07/10/15 (from the past 24 hour(s))   Urinalysis, Routine w reflex microscopic (not at [facility])     Status: Abnormal   Collection Time: 07/10/15  6:48 PM  Result Value Ref Range   Color, Urine YELLOW YELLOW   APPearance CLEAR CLEAR   Specific Gravity, Urine 1.020 1.005 - 1.030   pH 5.5 5.0 - 8.0   Glucose, UA NEGATIVE NEGATIVE mg/dL   Hgb urine dipstick MODERATE (A) NEGATIVE   Bilirubin Urine NEGATIVE NEGATIVE   Ketones, ur NEGATIVE NEGATIVE mg/dL   Protein, ur NEGATIVE NEGATIVE mg/dL   Nitrite NEGATIVE NEGATIVE   Leukocytes, UA NEGATIVE NEGATIVE  Urine microscopic-add on     Status: Abnormal   Collection Time: 07/10/15  6:48 PM  Result Value Ref Range   Squamous Epithelial / LPF 0-5 (A) NONE SEEN   WBC, UA 0-5 0 - 5 WBC/hpf   RBC / HPF 6-30 0 - 5 RBC/hpf   Bacteria, UA FEW (A) NONE SEEN   [place]: No previa or abruption seen Cervical length 5cm   MAU Course  Procedures  MDM Category 1 tracing B positive Ultrasound shows posterior placenta, no evidence of abruption or previa, CL 5 cm Care turned over to [facility] CNM             Judeth Horn, NP 07/10/2015 8:04 PM   Assessment and Plan   1. [redacted] weeks gestation of pregnancy   2. Vaginal bleeding in pregnancy, second trimester    DC home Comfort measures reviewed  2nd Trimester precautions  Bleeding precautions Ectopic precautionPTL precautions  Fetal kick counts RX: none  Return to MAU as needed FU with OB as planned  Follow-up Information    Follow up with HARPER,CHARLES A, MD.   Specialty:  Obstetrics and Gynecology   Why:  As scheduled   Contact information:   [address] [place] [address] [phone]

## 2015-07-11 ENCOUNTER — Encounter: Payer: Self-pay | Admitting: *Deleted

## 2015-07-31 ENCOUNTER — Telehealth: Payer: Self-pay | Admitting: *Deleted

## 2015-08-01 NOTE — Telephone Encounter (Signed)
See telephone note for this encounter.

## 2015-08-02 ENCOUNTER — Ambulatory Visit (INDEPENDENT_AMBULATORY_CARE_PROVIDER_SITE_OTHER): Payer: Medicaid Other | Admitting: Obstetrics

## 2015-08-02 VITALS — BP 104/70 | HR 96 | Temp 97.2°F | Wt 155.2 lb

## 2015-08-02 DIAGNOSIS — J029 Acute pharyngitis, unspecified: Secondary | ICD-10-CM

## 2015-08-02 DIAGNOSIS — Z3482 Encounter for supervision of other normal pregnancy, second trimester: Secondary | ICD-10-CM

## 2015-08-02 LAB — POCT URINALYSIS DIPSTICK
BILIRUBIN UA: NEGATIVE
Blood, UA: NEGATIVE
GLUCOSE UA: NORMAL
KETONES UA: NEGATIVE
LEUKOCYTES UA: NEGATIVE
NITRITE UA: NEGATIVE
Protein, UA: NEGATIVE
Spec Grav, UA: 1.01
Urobilinogen, UA: NEGATIVE
pH, UA: 6

## 2015-08-02 LAB — POCT RAPID STREP A (OFFICE): Rapid Strep A Screen: POSITIVE — AB

## 2015-08-03 ENCOUNTER — Inpatient Hospital Stay (HOSPITAL_COMMUNITY)
Admission: AD | Admit: 2015-08-03 | Discharge: 2015-08-03 | Disposition: A | Discharge status: Home / Self Care | Payer: Medicaid Other | Source: Ambulatory Visit | Attending: Obstetrics & Gynecology | Admitting: Obstetrics & Gynecology

## 2015-08-03 ENCOUNTER — Encounter (HOSPITAL_COMMUNITY): Payer: Self-pay | Admitting: Certified Nurse Midwife

## 2015-08-03 DIAGNOSIS — Z3A26 26 weeks gestation of pregnancy: Secondary | ICD-10-CM | POA: Insufficient documentation

## 2015-08-03 DIAGNOSIS — O99512 Diseases of the respiratory system complicating pregnancy, second trimester: Secondary | ICD-10-CM | POA: Diagnosis not present

## 2015-08-03 DIAGNOSIS — Z87891 Personal history of nicotine dependence: Secondary | ICD-10-CM | POA: Insufficient documentation

## 2015-08-03 DIAGNOSIS — J069 Acute upper respiratory infection, unspecified: Secondary | ICD-10-CM | POA: Diagnosis not present

## 2015-08-03 DIAGNOSIS — W57XXXA Bitten or stung by nonvenomous insect and other nonvenomous arthropods, initial encounter: Secondary | ICD-10-CM | POA: Diagnosis not present

## 2015-08-03 DIAGNOSIS — J029 Acute pharyngitis, unspecified: Secondary | ICD-10-CM | POA: Insufficient documentation

## 2015-08-03 DIAGNOSIS — L299 Pruritus, unspecified: Secondary | ICD-10-CM | POA: Diagnosis present

## 2015-08-03 DIAGNOSIS — O99712 Diseases of the skin and subcutaneous tissue complicating pregnancy, second trimester: Secondary | ICD-10-CM | POA: Diagnosis not present

## 2015-08-03 MED ORDER — FLUTICASONE PROPIONATE 50 MCG/ACT NA SUSP: 1.0000 | Freq: Every day | NASAL | Status: DC

## 2015-08-03 MED ORDER — TRIAMCINOLONE ACETONIDE 0.025 % EX OINT: 1.0000 "application " | TOPICAL_OINTMENT | Freq: Two times a day (BID) | CUTANEOUS | Status: DC

## 2015-08-03 NOTE — Discharge Instructions (Signed)
Upper Respiratory Infection, Adult Most upper respiratory infections (URIs) are caused by a virus. A URI affects the nose, throat, and upper air passages. The most common type of URI is often called "the common cold." HOME CARE   Take medicines only as told by your doctor.  Gargle warm saltwater or take cough drops to comfort your throat as told by your doctor.  Use a warm mist humidifier or inhale steam from a shower to increase air moisture. This may make it easier to breathe.  Drink enough fluid to keep your pee (urine) clear or pale yellow.  Eat soups and other clear broths.  Have a healthy diet.  Rest as needed.  Go back to work when your fever is gone or your doctor says it is okay.  You may need to stay home longer to avoid giving your URI to others.  You can also wear a face mask and wash your hands often to prevent spread of the virus.  Use your inhaler more if you have asthma.  Do not use any tobacco products, including cigarettes, chewing tobacco, or electronic cigarettes. If you need help quitting, ask your doctor. GET HELP IF:  You are getting worse, not better.  Your symptoms are not helped by medicine.  You have chills.  You are getting more short of breath.  You have brown or red mucus.  You have yellow or brown discharge from your nose.  You have pain in your face, especially when you bend forward.  You have a fever.  You have puffy (swollen) neck glands.  You have pain while swallowing.  You have white areas in the back of your throat. GET HELP RIGHT AWAY IF:   You have very bad or constant:  Headache.  Ear pain.  Pain in your forehead, behind your eyes, and over your cheekbones (sinus pain).  Chest pain.  You have long-lasting (chronic) lung disease and any of the following:  Wheezing.  Long-lasting cough.  Coughing up blood.  A change in your usual mucus.  You have a stiff neck.  You have changes in  your:  Vision.  Hearing.  Thinking.  Mood. MAKE SURE YOU:   Understand these instructions.  Will watch your condition.  Will get help right away if you are not doing well or get worse.   This information is not intended to replace advice given to you by your health care provider. Make sure you discuss any questions you have with your health care provider.   Document Released: 06/24/2007 Document Revised: 05/22/2014 Document Reviewed: 04/12/2013 Elsevier Interactive Patient Education 2016 [employer].  Bedbugs Bedbugs are tiny bugs that live in and around beds. During the day, they stay hidden. At night, they come out and bite. WHERE ARE BEDBUGS FOUND? Bedbugs can be found anywhere. It does not matter if a place is clean or dirty. They are often found in:  Hotels.  Shelters.  Dorms.  Hospitals.  Nursing homes.  Places where there are many birds or bats. WHAT ARE BEDBUG BITES LIKE? A bedbug bite leaves a small red bump with a darker red dot in the middle. The bump may show up soon after a person is bitten or a day or more later. Bedbug bites usually do not hurt, but they may itch. Most people do not need treatment for bedbug bites. The bumps usually go away on their own in a few days. HOW DO I CHECK FOR BEDBUGS? Bedbugs are reddish-brown, oval, and flat. They  are very small and they cannot fly. Look for bedbugs in these places:  On mattresses, bed frames, headboards, and box springs.  On drapes and curtains in bedrooms.  Under the carpet in bedrooms.  Behind electrical outlets.  Behind any wallpaper that is peeling.  Inside luggage. Also look for black or red spots or stains on or near the bed. WHAT SHOULD I DO IF I FIND BEDBUGS? When Traveling Check your clothes, suitcase, and belongings for bedbugs before you go back home. You may want to throw away anything that has bedbugs on it. At Home Your bedroom may need to be treated by a pest control expert. You  may also need to throw away mattresses or luggage. To help keep bedbugs from coming back, you may want to:  Put a plastic cover over your mattress.  Wash your clothes and bedding in water that is hotter than 120F (48.9C). Dry them on a hot setting.  Vacuum often around the bed and in all of the cracks where the bugs might hide.  Check all used furniture, bedding, or clothes that you bring into your home.  Get rid of bird nests and bat roosts that are near your home. In Your Bed Try wearing pajamas that have long sleeves and pant legs. Bedbugs usually bite areas of the skin that are not covered.   This information is not intended to replace advice given to you by your health care provider. Make sure you discuss any questions you have with your health care provider.   Document Released: 04/22/2010 Document Revised: 05/22/2014 Document Reviewed: 01/01/2014 Elsevier Interactive Patient Education [employer]2016 Elsevier Inc.

## 2015-08-03 NOTE — MAU Note (Signed)
Pt states she has numerous bed bug bites that are making her miserable. Pt states she is scratching her skin incessantly. Pt states she has a sore throat and cough. Pt denies VB, LOF, or ctxs. Fetus active.

## 2015-08-03 NOTE — MAU Provider Note (Signed)
MAU HISTORY AND PHYSICAL  Chief Complaint:  Insect Bite and Sore Throat   Sonya MonteGloria Foster is a 25 y.o.  [phone] with IUP at 7973w3d presenting for Insect Bite and Sore Throat . Patient states she has been having  none contractions, none vaginal bleeding, intact membranes, with active fetal movement.   Pt's primary complaint is severely itchy bed bug bites. They are mostly on her left arm, but also on her legs and back. She has tried benadryl and topical OTC hydrocortisone cream without much improvement. She also has 3 days of runny nose cough and sore throat. She denies fever or chills. No N/V/D. Her husband also has the insect bites. They have changed the sheets but are getting there home sprayed at this time. Her husband as well as daughters are also sick right now.   Past Medical History  Diagnosis Date  . Genital HSV   . History of chlamydia   . Late prenatal care   . Abnormal Pap smear     History reviewed. No pertinent past surgical history.  Family History  Problem Relation Age of Onset  . Diabetes Maternal Grandmother   . Hypertension Maternal Grandmother   . Diabetes Maternal Grandfather   . Hypertension Maternal Grandfather     Social History  Substance Use Topics  . Smoking status: Former Smoker    Quit date: 06/24/2013  . Smokeless tobacco: None  . Alcohol Use: No     Comment: occ    No Known Allergies  Prescriptions prior to admission  Medication Sig Dispense Refill Last Dose  . acetaminophen (TYLENOL) 325 MG tablet Take 2 tablets (650 mg total) by mouth every 6 (six) hours as needed for moderate pain. 30 tablet 0 Past Week at Unknown time  . cyclobenzaprine (FLEXERIL) 10 MG tablet Take 1 tablet (10 mg total) by mouth 3 (three) times daily as needed for muscle spasms. 20 tablet 0 Past Week at Unknown time  . fluconazole (DIFLUCAN) 100 MG tablet Take 1 tablet (100 mg total) by mouth once. Repeat dose in 48-72 hour. (Patient not taking: Reported on 07/10/2015) 3  tablet 0 Not Taking at Unknown time  . Prenatal Vit-Fe Fumarate-FA (PRENATAL MULTIVITAMIN) TABS tablet Take 1 tablet by mouth at bedtime.    07/09/2015 at Unknown time  . Prenatal Vit-Fe Phos-FA-Omega (VITAFOL GUMMIES) 3.33-0.333-34.8 MG CHEW Chew 3 tablets by mouth daily. (Patient not taking: Reported on 07/10/2015) 90 tablet 12 Not Taking at Unknown time  . terconazole (TERAZOL 3) 0.8 % vaginal cream Place 1 applicator vaginally at bedtime. (Patient not taking: Reported on 07/10/2015) 20 g 0 Not Taking at Unknown time    Review of Systems - Negative except for what is mentioned in HPI.  Physical Exam  Blood pressure 116/68, pulse 95, temperature 98.2 F (36.8 C), temperature source Oral, resp. rate 18, last menstrual period 02/08/2015. GENERAL: Well-developed, well-nourished female in no acute distress.  HEENT: Cobblestoning in posterior pharynx, minimal erythema, minimal anterior cervical lymphadneopathy LUNGS: Clear to auscultation bilaterally. No wheezes rhonchi or rales HEART: Regular rate and rhythm. ABDOMEN: Soft, nontender, nondistended, gravid.  EXTREMITIES: Nontender, no edema, 2+ distal pulses. Cervical Exam: no preformed no OB complaints today FHT:  140/mod var/reactive for age Contractions: Every none   Labs: No results found for this or any previous visit (from the past 24 hour(s)).  Imaging Studies:  [place] [facility] Ob [facility]  07/11/2015  OBSTETRICAL ULTRASOUND: This exam was performed within a [facility]. The OB US report  was generated in the [facility] system, and faxed to the ordering physician.  This report is available in the [employer]. See the [facility] Obstetric US report via the Image Link.   Assessment: Sonya Foster is  25 y.o. [phone] at [redacted]w[redacted]d presents with Insect Bite and Sore Throat   Plan :#1 Bed bugs. Pt was advised on how to clean the home and beding to decreased infestation. She was given topical triamcinalone ointment to apply to lesions until  healed to decrease itching. Some concern for scabies, but no tracking so less likley. #2 viral upper respiratory syndrome. Will treat with supportive care and flonase to decrease post nasal drip.   Ernestina Penna 7/15/20171:45 PM

## 2015-08-05 ENCOUNTER — Encounter: Payer: Self-pay | Admitting: Obstetrics

## 2015-08-05 NOTE — Progress Notes (Signed)
Subjective:    Sonya Foster is a 25 y.o. female being seen today for her obstetrical visit. She is at 4949w5d gestation. Patient reports: no complaints . Fetal movement: normal.  Problem List Items Addressed This Visit    Encounter for supervision of other normal pregnancy in second trimester - Primary   Relevant Orders   POCT urinalysis dipstick (Completed)    Other Visit Diagnoses    Sore throat        Relevant Orders    POCT rapid strep A (Completed)      Patient Active Problem List   Diagnosis Date Noted  . Encounter for supervision of other normal pregnancy in second trimester 07/05/2015   Objective:    BP 104/70 mmHg  Pulse 96  Temp(Src) 97.2 F (36.2 C)  Wt 155 lb 3.2 oz (70.398 kg)  LMP 02/08/2015 FHT: 150 BPM  Uterine Size: size equals dates     Assessment:    Pregnancy @ 5749w5d    Plan:    Signs and symptoms of preterm labor: discussed.  Labs, problem list reviewed and updated 2 hr GTT planned Follow up in 2 weeks.

## 2015-08-16 ENCOUNTER — Other Ambulatory Visit: Payer: Medicaid Other

## 2015-08-16 DIAGNOSIS — Z3493 Encounter for supervision of normal pregnancy, unspecified, third trimester: Secondary | ICD-10-CM

## 2015-08-17 LAB — CBC
HEMATOCRIT: 33 % — AB (ref 34.0–46.6)
Hemoglobin: 11.6 g/dL (ref 11.1–15.9)
MCH: 29.8 pg (ref 26.6–33.0)
MCHC: 35.2 g/dL (ref 31.5–35.7)
MCV: 85 fL (ref 79–97)
PLATELETS: 144 10*3/uL — AB (ref 150–379)
RBC: 3.89 x10E6/uL (ref 3.77–5.28)
RDW: 12.8 % (ref 12.3–15.4)
WBC: 5.1 10*3/uL (ref 3.4–10.8)

## 2015-08-17 LAB — GLUCOSE TOLERANCE, 2 HOURS W/ 1HR
GLUCOSE, 1 HOUR: 114 mg/dL (ref 65–179)
GLUCOSE, FASTING: 84 mg/dL (ref 65–91)
Glucose, 2 hour: 92 mg/dL (ref 65–152)

## 2015-08-17 LAB — RPR: RPR Ser Ql: NONREACTIVE

## 2015-08-17 LAB — HIV ANTIBODY (ROUTINE TESTING W REFLEX): HIV Screen 4th Generation wRfx: NONREACTIVE

## 2015-08-20 ENCOUNTER — Other Ambulatory Visit: Payer: Self-pay | Admitting: Certified Nurse Midwife

## 2015-08-20 ENCOUNTER — Encounter: Payer: Medicaid Other | Admitting: Certified Nurse Midwife

## 2015-08-22 ENCOUNTER — Encounter: Payer: Self-pay | Admitting: Certified Nurse Midwife

## 2015-08-27 ENCOUNTER — Ambulatory Visit (INDEPENDENT_AMBULATORY_CARE_PROVIDER_SITE_OTHER): Payer: Medicaid Other | Admitting: Certified Nurse Midwife

## 2015-08-27 VITALS — BP 110/70 | HR 92 | Temp 98.7°F | Wt 161.2 lb

## 2015-08-27 DIAGNOSIS — Z3493 Encounter for supervision of normal pregnancy, unspecified, third trimester: Secondary | ICD-10-CM

## 2015-08-27 LAB — POCT URINALYSIS DIPSTICK
BILIRUBIN UA: NEGATIVE
Blood, UA: NEGATIVE
GLUCOSE UA: NEGATIVE
KETONES UA: NEGATIVE
LEUKOCYTES UA: NEGATIVE
Nitrite, UA: NEGATIVE
Protein, UA: NEGATIVE
Spec Grav, UA: 1.01
Urobilinogen, UA: 0.2
pH, UA: 5

## 2015-08-27 NOTE — Progress Notes (Signed)
Pt c/o lower abdominal pain and pressure.  

## 2015-08-27 NOTE — Progress Notes (Signed)
Subjective:    Sonya Foster is a 25 y.o. female being seen today for her obstetrical visit. She is at 3410w6d gestation. Patient reports backache, no bleeding, no contractions, no cramping and no leaking. Fetal movement: normal.  Problem List Items Addressed This Visit    None    Visit Diagnoses   None.    Patient Active Problem List   Diagnosis Date Noted  . Encounter for supervision of other normal pregnancy in second trimester 07/05/2015   Objective:    BP 110/70   Pulse 92   Temp 98.7 F (37.1 C)   Wt 161 lb 3.2 oz (73.1 kg)   LMP 02/08/2015   BMI 28.56 kg/m  FHT:  160 BPM  Uterine Size: size equals dates  Presentation: cephalic     Assessment:    Pregnancy @ 6310w6d weeks   Plan:   Rx: abdominal maternity support belt   labs reviewed, problem list updated Consent signed. GBS planning TDAP offered  Rhogam given for RH negative Pediatrician: discussed. Infant feeding: plans to breastfeed. Maternity leave: discussed. Cigarette smoking: never smoked. No orders of the defined types were placed in this encounter.  No orders of the defined types were placed in this encounter.  Follow up in 2 Weeks.

## 2015-09-10 ENCOUNTER — Encounter: Payer: Medicaid Other | Admitting: Certified Nurse Midwife

## 2015-09-10 ENCOUNTER — Ambulatory Visit (INDEPENDENT_AMBULATORY_CARE_PROVIDER_SITE_OTHER): Payer: Medicaid Other | Admitting: Certified Nurse Midwife

## 2015-09-10 DIAGNOSIS — O9989 Other specified diseases and conditions complicating pregnancy, childbirth and the puerperium: Secondary | ICD-10-CM

## 2015-09-10 DIAGNOSIS — Z3482 Encounter for supervision of other normal pregnancy, second trimester: Secondary | ICD-10-CM

## 2015-09-10 NOTE — Progress Notes (Signed)
Subjective:    Sonya Foster is a 25 y.o. female being seen today for her obstetrical visit. She is at 3060w6d gestation. Patient reports contractions since yesturday, no bleeding and no leaking. Fetal movement: normal.  Problem List Items Addressed This Visit    None    Visit Diagnoses   None.    Patient Active Problem List   Diagnosis Date Noted  . Encounter for supervision of other normal pregnancy in second trimester 07/05/2015   Objective:    BP 115/78   Pulse 83   LMP 02/08/2015  FHT:  150 BPM  Uterine Size: 31 cm and size equals dates  Presentation: cephalic    NST: + accels, no decels, moderate variability, Cat. 1 tracing. No contractions on toco.  Assessment:    Pregnancy @ 7660w6d weeks   Reactive NST  Plan:     labs reviewed, problem list updated Consent signed. GBS planning TDAP offered  Rhogam given for RH negative Pediatrician: discussed. Infant feeding: plans to breastfeed. Maternity leave: discussed. Cigarette smoking: never smoked. No orders of the defined types were placed in this encounter.  No orders of the defined types were placed in this encounter.  Follow up in 2 Weeks.

## 2015-09-20 ENCOUNTER — Inpatient Hospital Stay (HOSPITAL_COMMUNITY)
Admission: AD | Admit: 2015-09-20 | Discharge: 2015-09-20 | Disposition: A | Discharge status: Home / Self Care | Payer: Medicaid Other | Source: Ambulatory Visit | Attending: Obstetrics & Gynecology | Admitting: Obstetrics & Gynecology

## 2015-09-20 ENCOUNTER — Encounter (HOSPITAL_COMMUNITY): Payer: Self-pay

## 2015-09-20 DIAGNOSIS — Z3A33 33 weeks gestation of pregnancy: Secondary | ICD-10-CM | POA: Diagnosis not present

## 2015-09-20 DIAGNOSIS — O26893 Other specified pregnancy related conditions, third trimester: Secondary | ICD-10-CM | POA: Diagnosis present

## 2015-09-20 DIAGNOSIS — O9989 Other specified diseases and conditions complicating pregnancy, childbirth and the puerperium: Secondary | ICD-10-CM | POA: Diagnosis not present

## 2015-09-20 DIAGNOSIS — M940 Chondrocostal junction syndrome [Tietze]: Secondary | ICD-10-CM | POA: Insufficient documentation

## 2015-09-20 DIAGNOSIS — R079 Chest pain, unspecified: Secondary | ICD-10-CM | POA: Diagnosis present

## 2015-09-20 DIAGNOSIS — Z79899 Other long term (current) drug therapy: Secondary | ICD-10-CM | POA: Insufficient documentation

## 2015-09-20 DIAGNOSIS — Z87891 Personal history of nicotine dependence: Secondary | ICD-10-CM | POA: Insufficient documentation

## 2015-09-20 LAB — URINALYSIS, ROUTINE W REFLEX MICROSCOPIC
BILIRUBIN URINE: NEGATIVE
Glucose, UA: NEGATIVE mg/dL
HGB URINE DIPSTICK: NEGATIVE
KETONES UR: NEGATIVE mg/dL
Leukocytes, UA: NEGATIVE
NITRITE: NEGATIVE
PH: 6 (ref 5.0–8.0)
Protein, ur: NEGATIVE mg/dL
Specific Gravity, Urine: 1.015 (ref 1.005–1.030)

## 2015-09-20 MED ORDER — CYCLOBENZAPRINE HCL 5 MG PO TABS: 5.0000 mg | ORAL_TABLET | Freq: Three times a day (TID) | ORAL | 0 refills | Status: DC | PRN

## 2015-09-20 MED ORDER — PROMETHAZINE HCL 12.5 MG PO TABS: 12.5000 mg | ORAL_TABLET | Freq: Four times a day (QID) | ORAL | 0 refills | Status: DC | PRN

## 2015-09-20 NOTE — MAU Note (Signed)
Patient presents stating that Sonya Foster told her to come here for EKG due to chest pain.

## 2015-09-20 NOTE — Discharge Instructions (Signed)

## 2015-09-20 NOTE — MAU Note (Signed)
Called respiratory for EKG.

## 2015-09-20 NOTE — MAU Provider Note (Signed)
History     CSN: [id number]  Arrival date and time: 09/20/15 1038     Chief Complaint  Patient presents with  . Chest Pain   HPI Patient is a 25 year old G4 P3 at 33 weeks and 2 days who presents at the direction of her midwife from [facility] to the MAU with chest pain. She reports his chest pain is been going on for 3 days. It's worse in the morning but happens all day. She says it comes and goes and spasms. It's not associated with shortness of breath and radiation of symptoms down her arms or into her neck. She has not had pain like this before. She denies any unusual lifting activities. She has no fevers chills nausea vomiting. She denies cough.  OB History    Gravida Para Term Preterm AB Living   4 3 3     3    SAB TAB Ectopic Multiple Live Births           3      Past Medical History:  Diagnosis Date  . Abnormal Pap smear   . Genital HSV   . History of chlamydia   . Late prenatal care     History reviewed. No pertinent surgical history.  Family History  Problem Relation Age of Onset  . Diabetes Maternal Grandmother   . Hypertension Maternal Grandmother   . Diabetes Maternal Grandfather   . Hypertension Maternal Grandfather     Social History  Substance Use Topics  . Smoking status: Former Smoker    Quit date: 06/24/2013  . Smokeless tobacco: Never Used  . Alcohol use No     Comment: occ    Allergies: No Known Allergies  Prescriptions Prior to Admission  Medication Sig Dispense Refill Last Dose  . cyclobenzaprine (FLEXERIL) 10 MG tablet Take 1 tablet (10 mg total) by mouth 3 (three) times daily as needed for muscle spasms. 20 tablet 0 09/19/2015 at Unknown time  . Prenatal Vit-Fe Fumarate-FA (PRENATAL MULTIVITAMIN) TABS tablet Take 1 tablet by mouth at bedtime.    09/19/2015 at Unknown time  . PROMETHAZINE HCL PO Take 1 tablet by mouth as needed. Patient said she wasn't given one by a friend   09/19/2015 at Unknown time  . acetaminophen (TYLENOL) 325 MG tablet  Take 2 tablets (650 mg total) by mouth every 6 (six) hours as needed for moderate pain. (Patient not taking: Reported on 09/20/2015) 30 tablet 0 Not Taking at Unknown time  . fluconazole (DIFLUCAN) 100 MG tablet Take 1 tablet (100 mg total) by mouth once. Repeat dose in 48-72 hour. (Patient not taking: Reported on 07/10/2015) 3 tablet 0 Not Taking at Unknown time  . fluticasone (FLONASE) 50 MCG/ACT nasal spray Place 1 spray into both nostrils daily. 16 g 2   . Prenatal Vit-Fe Phos-FA-Omega (VITAFOL GUMMIES) 3.33-0.333-34.8 MG CHEW Chew 3 tablets by mouth daily. (Patient not taking: Reported on 07/10/2015) 90 tablet 12 Not Taking at Unknown time  . terconazole (TERAZOL 3) 0.8 % vaginal cream Place 1 applicator vaginally at bedtime. (Patient not taking: Reported on 07/10/2015) 20 g 0 Not Taking at Unknown time  . triamcinolone (KENALOG) 0.025 % ointment Apply 1 application topically 2 (two) times daily. To areas affected by bed bug bites (Patient not taking: Reported on 09/20/2015) 30 g 0 Not Taking at Unknown time    Review of Systems  Constitutional: Negative for chills and fever.  HENT: Negative for congestion and ear discharge.   Eyes: Negative for blurred  vision and double vision.  Respiratory: Negative for cough, hemoptysis, sputum production and shortness of breath.   Cardiovascular: Positive for chest pain. Negative for palpitations.  Gastrointestinal: Positive for nausea. Negative for abdominal pain, heartburn and vomiting.  Genitourinary: Negative for dysuria, frequency and urgency.  Musculoskeletal: Negative for myalgias and neck pain.  Skin: Negative for itching and rash.  Neurological: Negative for dizziness and tingling.  Endo/Heme/Allergies: Negative for environmental allergies. Does not bruise/bleed easily.   Physical Exam   Blood pressure 115/67, pulse 93, temperature 98.5 F (36.9 C), temperature source Oral, resp. rate 17, height 5\' 3"  (1.6 m), weight 167 lb 10.2 oz (76 kg), last  menstrual period 02/08/2015, SpO2 98 %.  Physical Exam  Constitutional: She is oriented to person, place, and time. She appears well-developed and well-nourished.  HENT:  Head: Normocephalic and atraumatic.  Neck: Normal range of motion. Neck supple.  Cardiovascular: Normal rate, regular rhythm and normal heart sounds.  Exam reveals no gallop and no friction rub.   No murmur heard. Respiratory: Effort normal and breath sounds normal. No respiratory distress. She has no wheezes.  Significant tenderness to palpation over the anterior chest wall. Bilaterally.  GI: Soft. Bowel sounds are normal. She exhibits no distension. There is no tenderness. There is no rebound.  Musculoskeletal: Normal range of motion. She exhibits no edema.  Lymphadenopathy:    She has no cervical adenopathy.  Neurological: She is alert and oriented to person, place, and time.  Skin: She is not diaphoretic.    MAU Course  Procedures  MDM In the MAU patient did undergo EKG testing which was unremarkable. Revealed normal sinus rhythm. Patient's pain was easily reproducible on palpation. This led me to believe that this likely is costochondritis versus some other musculoskeletal etiology. It is likely not cardiac or pulmonary in nature.  Assessment and Plan  Costochondritis, acute    Advised heating pad Tylenol use. Did give patient a prescription for several when necessary Flexeril. Patient initially complaining of some chronic nausea asking for refill on her Phenergan. Refilled today.  Ernestina Penna 09/20/2015, 12:02 PM

## 2015-09-27 ENCOUNTER — Ambulatory Visit (INDEPENDENT_AMBULATORY_CARE_PROVIDER_SITE_OTHER): Payer: Medicaid Other | Admitting: Certified Nurse Midwife

## 2015-09-27 VITALS — BP 133/81 | HR 87 | Temp 98.1°F | Wt 167.2 lb

## 2015-09-27 DIAGNOSIS — Z3483 Encounter for supervision of other normal pregnancy, third trimester: Secondary | ICD-10-CM

## 2015-09-27 LAB — OB RESULTS CONSOLE GC/CHLAMYDIA
CHLAMYDIA, DNA PROBE: NEGATIVE
Gonorrhea: NEGATIVE

## 2015-09-27 LAB — OB RESULTS CONSOLE GBS: STREP GROUP B AG: POSITIVE

## 2015-09-27 NOTE — Addendum Note (Signed)
Addended by: Lear NgMARTIN, MISTY L on: 09/27/2015 04:09 PM   Modules accepted: Orders

## 2015-09-27 NOTE — Progress Notes (Signed)
Subjective:    Sonya Foster is a 25 y.o. female being seen today for her obstetrical visit. She is at 2426w2d gestation. Patient reports no complaints. Fetal movement: normal.  Problem List Items Addressed This Visit    None    Visit Diagnoses    Supervision of other normal pregnancy, antepartum, third trimester    -  Primary     Patient Active Problem List   Diagnosis Date Noted  . Encounter for supervision of other normal pregnancy in second trimester 07/05/2015   Objective:    BP 133/81   Pulse 87   Temp 98.1 F (36.7 C)   Wt 167 lb 3.2 oz (75.8 kg)   LMP 02/08/2015   BMI 29.62 kg/m  FHT:  142 BPM  Uterine Size: 34 cm and size equals dates  Presentation: cephalic   Cervix: soft, 1 cm, posterior, vertex, -3 station  Assessment:    Pregnancy @ 926w2d weeks   Braxton-Hicks contractions  Plan:     labs reviewed, problem list updated Consent signed. GBS sent TDAP offered  Rhogam given for RH negative Pediatrician: discussed. Infant feeding: plans to breastfeed. Maternity leave: discussed. Cigarette smoking: never smoked. No orders of the defined types were placed in this encounter.  No orders of the defined types were placed in this encounter.  Follow up in 1 Week.

## 2015-09-27 NOTE — Progress Notes (Signed)
Pt c/o irregular  concractions, vaginal pressure.

## 2015-09-27 NOTE — Patient Instructions (Addendum)
Third Trimester of Pregnancy The third trimester is from week 29 through week 42, months 7 through 9. The third trimester is a time when the fetus is growing rapidly. At the end of the ninth month, the fetus is about 20 inches in length and weighs 6-10 pounds.  BODY CHANGES Your body goes through many changes during pregnancy. The changes vary from woman to woman.   Your weight will continue to increase. You can expect to gain 25-35 pounds (11-16 kg) by the end of the pregnancy.  You may begin to get stretch marks on your hips, abdomen, and breasts.  You may urinate more often because the fetus is moving lower into your pelvis and pressing on your bladder.  You may develop or continue to have heartburn as a result of your pregnancy.  You may develop constipation because certain hormones are causing the muscles that push waste through your intestines to slow down.  You may develop hemorrhoids or swollen, bulging veins (varicose veins).  You may have pelvic pain because of the weight gain and pregnancy hormones relaxing your joints between the bones in your pelvis. Backaches may result from overexertion of the muscles supporting your posture.  You may have changes in your hair. These can include thickening of your hair, rapid growth, and changes in texture. Some women also have hair loss during or after pregnancy, or hair that feels dry or thin. Your hair will most likely return to normal after your baby is born.  Your breasts will continue to grow and be tender. A yellow discharge may leak from your breasts called colostrum.  Your belly button may stick out.  You may feel short of breath because of your expanding uterus.  You may notice the fetus "dropping," or moving lower in your abdomen.  You may have a bloody mucus discharge. This usually occurs a few days to a week before labor begins.  Your cervix becomes thin and soft (effaced) near your due date. WHAT TO EXPECT AT YOUR PRENATAL  EXAMS  You will have prenatal exams every 2 weeks until week 36. Then, you will have weekly prenatal exams. During a routine prenatal visit:  You will be weighed to make sure you and the fetus are growing normally.  Your blood pressure is taken.  Your abdomen will be measured to track your baby's growth.  The fetal heartbeat will be listened to.  Any test results from the previous visit will be discussed.  You may have a cervical check near your due date to see if you have effaced. At around 36 weeks, your caregiver will check your cervix. At the same time, your caregiver will also perform a test on the secretions of the vaginal tissue. This test is to determine if a type of bacteria, Group B streptococcus, is present. Your caregiver will explain this further. Your caregiver may ask you:  What your birth plan is.  How you are feeling.  If you are feeling the baby move.  If you have had any abnormal symptoms, such as leaking fluid, bleeding, severe headaches, or abdominal cramping.  If you are using any tobacco products, including cigarettes, chewing tobacco, and electronic cigarettes.  If you have any questions. Other tests or screenings that may be performed during your third trimester include:  Blood tests that check for low iron levels (anemia).  Fetal testing to check the health, activity level, and growth of the fetus. Testing is done if you have certain medical conditions or if   there are problems during the pregnancy.  HIV (human immunodeficiency virus) testing. If you are at high risk, you may be screened for HIV during your third trimester of pregnancy. FALSE LABOR You may feel small, irregular contractions that eventually go away. These are called Braxton Hicks contractions, or false labor. Contractions may last for hours, days, or even weeks before true labor sets in. If contractions come at regular intervals, intensify, or become painful, it is best to be seen by your  caregiver.  SIGNS OF LABOR   Menstrual-like cramps.  Contractions that are 5 minutes apart or less.  Contractions that start on the top of the uterus and spread down to the lower abdomen and back.  A sense of increased pelvic pressure or back pain.  A watery or bloody mucus discharge that comes from the vagina. If you have any of these signs before the 37th week of pregnancy, call your caregiver right away. You need to go to the hospital to get checked immediately. HOME CARE INSTRUCTIONS   Avoid all smoking, herbs, alcohol, and unprescribed drugs. These chemicals affect the formation and growth of the baby.  Do not use any tobacco products, including cigarettes, chewing tobacco, and electronic cigarettes. If you need help quitting, ask your health care provider. You may receive counseling support and other resources to help you quit.  Follow your caregiver's instructions regarding medicine use. There are medicines that are either safe or unsafe to take during pregnancy.  Exercise only as directed by your caregiver. Experiencing uterine cramps is a good sign to stop exercising.  Continue to eat regular, healthy meals.  Wear a good support bra for breast tenderness.  Do not use hot tubs, steam rooms, or saunas.  Wear your seat belt at all times when driving.  Avoid raw meat, uncooked cheese, cat litter boxes, and soil used by cats. These carry germs that can cause birth defects in the baby.  Take your prenatal vitamins.  Take 1500-2000 mg of calcium daily starting at the 20th week of pregnancy until you deliver your baby.  Try taking a stool softener (if your caregiver approves) if you develop constipation. Eat more high-fiber foods, such as fresh vegetables or fruit and whole grains. Drink plenty of fluids to keep your urine clear or pale yellow.  Take warm sitz baths to soothe any pain or discomfort caused by hemorrhoids. Use hemorrhoid cream if your caregiver approves.  If  you develop varicose veins, wear support hose. Elevate your feet for 15 minutes, 3-4 times a day. Limit salt in your diet.  Avoid heavy lifting, wear low heal shoes, and practice good posture.  Rest a lot with your legs elevated if you have leg cramps or low back pain.  Visit your dentist if you have not gone during your pregnancy. Use a soft toothbrush to brush your teeth and be gentle when you floss.  A sexual relationship may be continued unless your caregiver directs you otherwise.  Do not travel far distances unless it is absolutely necessary and only with the approval of your caregiver.  Take prenatal classes to understand, practice, and ask questions about the labor and delivery.  Make a trial run to the hospital.  Pack your hospital bag.  Prepare the baby's nursery.  Continue to go to all your prenatal visits as directed by your caregiver. SEEK MEDICAL CARE IF:  You are unsure if you are in labor or if your water has broken.  You have dizziness.  You have   mild pelvic cramps, pelvic pressure, or nagging pain in your abdominal area.  You have persistent nausea, vomiting, or diarrhea.  You have a bad smelling vaginal discharge.  You have pain with urination. SEEK IMMEDIATE MEDICAL CARE IF:   You have a fever.  You are leaking fluid from your vagina.  You have spotting or bleeding from your vagina.  You have severe abdominal cramping or pain.  You have rapid weight loss or gain.  You have shortness of breath with chest pain.  You notice sudden or extreme swelling of your face, hands, ankles, feet, or legs.  You have not felt your baby move in over an hour.  You have severe headaches that do not go away with medicine.  You have vision changes.   This information is not intended to replace advice given to you by your health care provider. Make sure you discuss any questions you have with your health care provider.   Document Released: 12/30/2000 Document  Revised: 01/26/2014 Document Reviewed: 03/08/2012 Elsevier Interactive Patient Education 2016 Else[employer] Inc[employer]   Preterm Labor Information Preterm labor is when labor starts at less than 37 weeks of pregnancy. The normal length of a pregnancy is 39 to 41 weeks. CAUSES Often, there is no identifiable underlying cause as to why a woman goes into preterm labor. One of the most common known causes of preterm labor is infection. Infections of the uterus, cervix, vagina, amniotic sac, bladder, kidney, or even the lungs (pneumonia) can cause labor to start. Other suspected causes of preterm labor include:   Urogenital infections, such as yeast infections and bacterial vaginosis.   Uterine abnormalities (uterine shape, uterine septum, fibroids, or bleeding from the placenta).   A cervix that has been operated on (it may fail to stay closed).   Malformations in the fetus.   Multiple gestations (twins, triplets, and so on).   Breakage of the amniotic sac.  RISK FACTORS  Having a previous history of preterm labor.   Having premature rupture of membranes (PROM).   Having a placenta that covers the opening of the cervix (placenta previa).   Having a placenta that separates from the uterus (placental abruption).   Having a cervix that is too weak to hold the fetus in the uterus (incompetent cervix).   Having too much fluid in the amniotic sac (polyhydramnios).   Taking illegal drugs or smoking while pregnant.   Not gaining enough weight while pregnant.   Being younger than 18 and older than 27 years old.   Having a low socioeconomic status.   Being African American. SYMPTOMS Signs and symptoms of preterm labor include:   Menstrual-like cramps, abdominal pain, or back pain.  Uterine contractions that are regular, as frequent as six in an hour, regardless of their intensity (may be mild or painful).  Contractions that start on the top of the uterus and spread down to  the lower abdomen and back.   A sense of increased pelvic pressure.   A watery or bloody mucus discharge that comes from the vagina.  TREATMENT Depending on the length of the pregnancy and other circumstances, your health care provider may suggest bed rest. If necessary, there are medicines that can be given to stop contractions and to mature the fetal lungs. If labor happens before 34 weeks of pregnancy, a prolonged hospital stay may be recommended. Treatment depends on the condition of both you and the fetus.  WHAT SHOULD YOU DO IF YOU THINK YOU ARE IN PRETERM LABOR?   Call your health care provider right away. You will need to go to the hospital to get checked immediately. HOW CAN YOU PREVENT PRETERM LABOR IN FUTURE PREGNANCIES? You should:   Stop smoking if you smoke.  Maintain healthy weight gain and avoid chemicals and drugs that are not necessary.  Be watchful for any type of infection.  Inform your health care provider if you have a known history of preterm labor.   This information is not intended to replace advice given to you by your health care provider. Make sure you discuss any questions you have with your health care provider.   Document Released: 03/28/2003 Document Revised: 09/07/2012 Document Reviewed: 02/08/2012 Elsevier Interactive Patient Education 2016 Elsevier Inc.  

## 2015-09-30 ENCOUNTER — Other Ambulatory Visit: Payer: Self-pay | Admitting: Certified Nurse Midwife

## 2015-09-30 DIAGNOSIS — O99820 Streptococcus B carrier state complicating pregnancy: Secondary | ICD-10-CM | POA: Insufficient documentation

## 2015-09-30 LAB — CULTURE, BETA STREP (GROUP B ONLY): Strep Gp B Culture: POSITIVE — AB

## 2015-10-03 ENCOUNTER — Other Ambulatory Visit: Payer: Self-pay | Admitting: Certified Nurse Midwife

## 2015-10-03 DIAGNOSIS — Z8619 Personal history of other infectious and parasitic diseases: Secondary | ICD-10-CM

## 2015-10-03 DIAGNOSIS — B3731 Acute candidiasis of vulva and vagina: Secondary | ICD-10-CM

## 2015-10-03 DIAGNOSIS — B373 Candidiasis of vulva and vagina: Secondary | ICD-10-CM

## 2015-10-03 LAB — NUSWAB VG+, CANDIDA 6SP
CANDIDA KRUSEI, NAA: NEGATIVE
CANDIDA LUSITANIAE, NAA: NEGATIVE
CANDIDA PARAPSILOSIS, NAA: NEGATIVE
CHLAMYDIA TRACHOMATIS, NAA: NEGATIVE
Candida albicans, NAA: POSITIVE — AB
Candida glabrata, NAA: NEGATIVE
Candida tropicalis, NAA: NEGATIVE
Neisseria gonorrhoeae, NAA: NEGATIVE
TRICH VAG BY NAA: NEGATIVE

## 2015-10-03 MED ORDER — FLUCONAZOLE 100 MG PO TABS: 100.0000 mg | ORAL_TABLET | Freq: Once | ORAL | 0 refills | Status: AC

## 2015-10-03 MED ORDER — VALACYCLOVIR HCL 500 MG PO TABS: 500.0000 mg | ORAL_TABLET | Freq: Two times a day (BID) | ORAL | 1 refills | Status: DC

## 2015-10-10 ENCOUNTER — Telehealth: Payer: Self-pay | Admitting: *Deleted

## 2015-10-10 NOTE — Telephone Encounter (Signed)
Unable to reach by phone after several attempts.[facility].

## 2015-10-11 ENCOUNTER — Ambulatory Visit (INDEPENDENT_AMBULATORY_CARE_PROVIDER_SITE_OTHER): Payer: Medicaid Other | Admitting: Obstetrics

## 2015-10-11 ENCOUNTER — Encounter: Payer: Self-pay | Admitting: Obstetrics

## 2015-10-11 ENCOUNTER — Other Ambulatory Visit: Payer: Self-pay | Admitting: Obstetrics

## 2015-10-11 VITALS — BP 104/69 | HR 83 | Temp 98.2°F | Wt 168.6 lb

## 2015-10-11 DIAGNOSIS — O99113 Other diseases of the blood and blood-forming organs and certain disorders involving the immune mechanism complicating pregnancy, third trimester: Secondary | ICD-10-CM

## 2015-10-11 DIAGNOSIS — Z3493 Encounter for supervision of normal pregnancy, unspecified, third trimester: Secondary | ICD-10-CM

## 2015-10-11 DIAGNOSIS — Z3483 Encounter for supervision of other normal pregnancy, third trimester: Secondary | ICD-10-CM

## 2015-10-11 DIAGNOSIS — Z8619 Personal history of other infectious and parasitic diseases: Secondary | ICD-10-CM

## 2015-10-11 DIAGNOSIS — D696 Thrombocytopenia, unspecified: Secondary | ICD-10-CM

## 2015-10-11 MED ORDER — VALACYCLOVIR HCL 500 MG PO TABS: 500.0000 mg | ORAL_TABLET | Freq: Every day | ORAL | 99 refills | Status: DC

## 2015-10-11 NOTE — Progress Notes (Signed)
Patient stated that she has irregular contractions and pressure and is wanted cervix checked today, patient reports good fetal movement and no vaginal bleeding.

## 2015-10-11 NOTE — Progress Notes (Signed)
Pt made aware of lab results from last visit, NuSwab. Pt made aware Rx was sent for yeast infection by provider.

## 2015-10-11 NOTE — Progress Notes (Signed)
Subjective:    Sonya Foster is a 25 y.o. female being seen today for her obstetrical visit. She is at 2485w2d gestation. Patient reports backache, occasional contractions and pressure. Fetal movement: normal.  Problem List Items Addressed This Visit    None    Visit Diagnoses    Encounter for supervision of other normal pregnancy in third trimester    -  Primary   Relevant Orders   Hemoglobinopathy evaluation   Varicella zoster antibody, IgG   VITAMIN D 25 Hydroxy (Vit-D Deficiency, Fractures)   Prenatal Profile I   Culture, OB Urine   ToxASSURE Select 13 (MW), Urine     Patient Active Problem List   Diagnosis Date Noted  . GBS (group B Streptococcus carrier), +RV culture, currently pregnant 09/30/2015  . Encounter for supervision of other normal pregnancy in second trimester 07/05/2015   Objective:    BP 104/69   Pulse 83   Temp 98.2 F (36.8 C)   Wt 168 lb 9.6 oz (76.5 kg)   LMP 02/08/2015   BMI 29.87 kg/m  FHT:  150 BPM  Uterine Size: size equals dates  Presentation: cephalic     Assessment:    Pregnancy @ 2785w2d weeks   Plan:     labs reviewed, problem list updated Consent signed. GBS sent TDAP offered  Rhogam given for RH negative Pediatrician: discussed. Infant feeding: plans to breastfeed. Maternity leave: discussed. Cigarette smoking: former smoker. Orders Placed This Encounter  Procedures  . Culture, OB Urine  . Hemoglobinopathy evaluation  . Varicella zoster antibody, IgG  . VITAMIN D 25 Hydroxy (Vit-D Deficiency, Fractures)  . Prenatal Profile I  . ToxASSURE Select 13 (MW), Urine   No orders of the defined types were placed in this encounter.  Follow up in 1 Week.   Patient ID: Sonya Foster, female   DOB: 1990/10/23, 25 y.o.   MRN: [id number]

## 2015-10-14 ENCOUNTER — Other Ambulatory Visit: Payer: Self-pay | Admitting: Obstetrics

## 2015-10-14 LAB — URINE CULTURE, OB REFLEX

## 2015-10-14 LAB — CULTURE, OB URINE

## 2015-10-14 NOTE — Progress Notes (Signed)
Patient not able to receive calls at this time

## 2015-10-16 ENCOUNTER — Ambulatory Visit (INDEPENDENT_AMBULATORY_CARE_PROVIDER_SITE_OTHER): Payer: Medicaid Other | Admitting: Obstetrics

## 2015-10-16 VITALS — BP 122/82 | HR 90

## 2015-10-16 DIAGNOSIS — D696 Thrombocytopenia, unspecified: Secondary | ICD-10-CM

## 2015-10-16 DIAGNOSIS — O99113 Other diseases of the blood and blood-forming organs and certain disorders involving the immune mechanism complicating pregnancy, third trimester: Secondary | ICD-10-CM | POA: Diagnosis not present

## 2015-10-16 DIAGNOSIS — Z3493 Encounter for supervision of normal pregnancy, unspecified, third trimester: Secondary | ICD-10-CM | POA: Diagnosis not present

## 2015-10-16 MED ORDER — AMOXICILLIN-POT CLAVULANATE 875-125 MG PO TABS: 1.0000 | ORAL_TABLET | Freq: Two times a day (BID) | ORAL | 0 refills | Status: DC

## 2015-10-16 NOTE — Progress Notes (Signed)
Subjective:    Verlee MonteGloria Krogh is a 25 y.o. female being seen today for her obstetrical visit. She is at 473w0d gestation. Patient reports no complaints. Fetal movement: normal.  Problem List Items Addressed This Visit    None    Visit Diagnoses    Thrombocytopenia complicating pregnancy, third trimester    -  Primary   Relevant Orders   AMB referral to maternal fetal medicine     Patient Active Problem List   Diagnosis Date Noted  . GBS (group B Streptococcus carrier), +RV culture, currently pregnant 09/30/2015  . Encounter for supervision of other normal pregnancy in second trimester 07/05/2015    Objective:    BP 122/82   Pulse 90   LMP 02/08/2015  FHT: 150 BPM  Uterine Size: size equals dates  Presentations: unsure   Assessment:    Pregnancy @ 9873w0d weeks   Plan:   Plans for delivery: Vaginal anticipated; labs reviewed; problem list updated Counseling: Consent signed. Infant feeding: plans to breastfeed. Cigarette smoking: former smoker. L&D discussion: symptoms of labor, discussed when to call, discussed what number to call, anesthetic/analgesic options reviewed and delivering clinician:  plans no preference. Postpartum supports and preparation: circumcision discussed and contraception plans discussed.  Follow up in 1 Week.  Patient ID: Verlee MonteGloria Cantrall, female   DOB: 1990-11-19, 25 y.o.   MRN: [id number]

## 2015-10-19 LAB — VITAMIN D 25 HYDROXY (VIT D DEFICIENCY, FRACTURES): Vit D, 25-Hydroxy: 33.1 ng/mL (ref 30.0–100.0)

## 2015-10-19 LAB — PRENATAL PROFILE I(LABCORP)
ANTIBODY SCREEN: NEGATIVE
BASOS: 0 %
Basophils Absolute: 0 10*3/uL (ref 0.0–0.2)
EOS (ABSOLUTE): 0.2 10*3/uL (ref 0.0–0.4)
Eos: 3 %
HEMATOCRIT: 33.1 % — AB (ref 34.0–46.6)
HEMOGLOBIN: 10.9 g/dL — AB (ref 11.1–15.9)
Hepatitis B Surface Ag: NEGATIVE
IMMATURE GRANS (ABS): 0 10*3/uL (ref 0.0–0.1)
Immature Granulocytes: 0 %
LYMPHS: 21 %
Lymphocytes Absolute: 1 10*3/uL (ref 0.7–3.1)
MCH: 27.2 pg (ref 26.6–33.0)
MCHC: 32.9 g/dL (ref 31.5–35.7)
MCV: 83 fL (ref 79–97)
MONOS ABS: 0.3 10*3/uL (ref 0.1–0.9)
Monocytes: 6 %
NEUTROS PCT: 70 %
Neutrophils Absolute: 3.2 10*3/uL (ref 1.4–7.0)
Platelets: 142 10*3/uL — ABNORMAL LOW (ref 150–379)
RBC: 4.01 x10E6/uL (ref 3.77–5.28)
RDW: 14 % (ref 12.3–15.4)
RPR: NONREACTIVE
RUBELLA: 4.88 {index} (ref >0.99)
Rh Factor: POSITIVE
WBC: 4.6 10*3/uL (ref 3.4–10.8)

## 2015-10-19 LAB — TOXASSURE SELECT 13 (MW), URINE

## 2015-10-19 LAB — HEMOGLOBINOPATHY EVALUATION
HGB A: 57.1 % — AB (ref 94.0–98.0)
HGB C: 0 %
HGB S: 38.9 % — AB
Hemoglobin A2 Quantitation: 4 % — ABNORMAL HIGH (ref 0.7–3.1)
Hemoglobin F Quantitation: 0 % (ref 0.0–2.0)

## 2015-10-19 LAB — VARICELLA ZOSTER ANTIBODY, IGG: Varicella zoster IgG: 2335 index (ref >165)

## 2015-10-23 ENCOUNTER — Ambulatory Visit (INDEPENDENT_AMBULATORY_CARE_PROVIDER_SITE_OTHER): Payer: Medicaid Other | Admitting: Obstetrics

## 2015-10-23 ENCOUNTER — Encounter: Payer: Self-pay | Admitting: Obstetrics

## 2015-10-23 VITALS — BP 114/76 | HR 89 | Temp 98.4°F | Wt 170.2 lb

## 2015-10-23 DIAGNOSIS — O99119 Other diseases of the blood and blood-forming organs and certain disorders involving the immune mechanism complicating pregnancy, unspecified trimester: Principal | ICD-10-CM

## 2015-10-23 DIAGNOSIS — D696 Thrombocytopenia, unspecified: Secondary | ICD-10-CM

## 2015-10-23 DIAGNOSIS — O99113 Other diseases of the blood and blood-forming organs and certain disorders involving the immune mechanism complicating pregnancy, third trimester: Secondary | ICD-10-CM | POA: Diagnosis not present

## 2015-10-23 DIAGNOSIS — Z3493 Encounter for supervision of normal pregnancy, unspecified, third trimester: Secondary | ICD-10-CM

## 2015-10-23 NOTE — Progress Notes (Signed)
Subjective:    Sonya Foster is a 25 y.o. female being seen today for her obstetrical visit. She is at 7552w0d gestation. Patient reports no complaints. Fetal movement: normal.  Problem List Items Addressed This Visit    None    Visit Diagnoses    Thrombocytopenia affecting pregnancy (HCC)    -  Primary   Relevant Orders   CBC   Prenatal care in third trimester         Patient Active Problem List   Diagnosis Date Noted  . GBS (group B Streptococcus carrier), +RV culture, currently pregnant 09/30/2015  . Encounter for supervision of other normal pregnancy in second trimester 07/05/2015    Objective:    BP 114/76   Pulse 89   Temp 98.4 F (36.9 C)   Wt 170 lb 3.2 oz (77.2 kg)   LMP 02/08/2015   BMI 30.15 kg/m  FHT: 150 BPM  Uterine Size: size equals dates  Presentations: cephalic    Assessment:    Pregnancy @ 3752w0d weeks    Thrombocytopenia - probable gestational thrombocytopenia  Plan:   Plans for delivery: Vaginal anticipated; labs reviewed; problem list updated.  Check CBC today.  Has consult with MFM on Friday. Counseling: Consent signed. Infant feeding: plans to breastfeed. Cigarette smoking: former smoker. L&D discussion: symptoms of labor, discussed when to call, discussed what number to call, anesthetic/analgesic options reviewed and delivering clinician:  plans no preference. Postpartum supports and preparation: circumcision discussed and contraception plans discussed.  Follow up in 1 Week.

## 2015-10-23 NOTE — Progress Notes (Signed)
Patient c/o lower abdominal tenderness.

## 2015-10-24 LAB — CBC
HEMOGLOBIN: 10.9 g/dL — AB (ref 11.1–15.9)
Hematocrit: 32.5 % — ABNORMAL LOW (ref 34.0–46.6)
MCH: 27.2 pg (ref 26.6–33.0)
MCHC: 33.5 g/dL (ref 31.5–35.7)
MCV: 81 fL (ref 79–97)
PLATELETS: 157 10*3/uL (ref 150–379)
RBC: 4.01 x10E6/uL (ref 3.77–5.28)
RDW: 13.9 % (ref 12.3–15.4)
WBC: 5.6 10*3/uL (ref 3.4–10.8)

## 2015-10-25 ENCOUNTER — Ambulatory Visit (HOSPITAL_COMMUNITY): Admission: RE | Admit: 2015-10-25 | Payer: Medicaid Other | Source: Ambulatory Visit

## 2015-10-29 ENCOUNTER — Ambulatory Visit (HOSPITAL_COMMUNITY)
Admission: RE | Admit: 2015-10-29 | Discharge: 2015-10-29 | Disposition: A | Discharge status: Home / Self Care | Payer: Medicaid Other | Source: Ambulatory Visit | Attending: Obstetrics | Admitting: Obstetrics

## 2015-10-29 ENCOUNTER — Encounter (HOSPITAL_COMMUNITY): Payer: Self-pay

## 2015-10-29 DIAGNOSIS — Z3A38 38 weeks gestation of pregnancy: Secondary | ICD-10-CM | POA: Insufficient documentation

## 2015-10-29 DIAGNOSIS — O99113 Other diseases of the blood and blood-forming organs and certain disorders involving the immune mechanism complicating pregnancy, third trimester: Secondary | ICD-10-CM | POA: Diagnosis not present

## 2015-10-29 DIAGNOSIS — D696 Thrombocytopenia, unspecified: Secondary | ICD-10-CM | POA: Diagnosis not present

## 2015-10-29 DIAGNOSIS — Z87891 Personal history of nicotine dependence: Secondary | ICD-10-CM | POA: Insufficient documentation

## 2015-10-29 NOTE — Consult Note (Signed)
Maternal Fetal Medicine Consultation  Requesting Provider(s): Coral Ceoharles Harper, MD  Reason for consultation: Thrombocytopenia  HPI: Sonya Foster  Is a 25 yo G4P3003, EDD 11/06/2015 who is currently at 38w 6d seen for consultation due to thrombocytopenia.  On previous labs, platelets noted to by in the 140 range.  Her most recent plt count was 150.  She denies any abnormal bleeding.  She denies any problems with thrombocytopenia prior to pregnancy or in her previous pregnancies.  Her prenatal course has otherwise been uncomplicated. She is without complaints today.  OB History: OB History    Gravida Para Term Preterm AB Living   4 3 3     3    SAB TAB Ectopic Multiple Live Births           3      PMH:  Past Medical History:  Diagnosis Date  . Abnormal Pap smear   . Genital HSV   . History of chlamydia   . Late prenatal care     PSH: neg  Meds:  Current Outpatient Prescriptions on File Prior to Encounter  Medication Sig Dispense Refill  . Prenatal Vit-Fe Phos-FA-Omega (VITAFOL GUMMIES) 3.33-0.333-34.8 MG CHEW Chew 3 tablets by mouth daily. 90 tablet 12  . promethazine (PHENERGAN) 12.5 MG tablet Take 1 tablet (12.5 mg total) by mouth every 6 (six) hours as needed for nausea or vomiting. 30 tablet 0  . valACYclovir (VALTREX) 500 MG tablet Take 1 tablet (500 mg total) by mouth daily. Take until after delivery for suppression. 30 tablet prn  . acetaminophen (TYLENOL) 325 MG tablet Take 2 tablets (650 mg total) by mouth every 6 (six) hours as needed for moderate pain. (Patient not taking: Reported on 10/29/2015) 30 tablet 0  . amoxicillin-clavulanate (AUGMENTIN) 875-125 MG tablet Take 1 tablet by mouth 2 (two) times daily. (Patient not taking: Reported on 10/29/2015) 14 tablet 0  . cyclobenzaprine (FLEXERIL) 5 MG tablet Take 1 tablet (5 mg total) by mouth 3 (three) times daily as needed for muscle spasms. (Patient not taking: Reported on 10/29/2015) 10 tablet 0  . Prenatal Vit-Fe  Fumarate-FA (PRENATAL MULTIVITAMIN) TABS tablet Take 1 tablet by mouth at bedtime.      No current facility-administered medications on file prior to encounter.    Allergies: No Known Allergies   FH:  Family History  Problem Relation Age of Onset  . Diabetes Maternal Grandmother   . Hypertension Maternal Grandmother   . Diabetes Maternal Grandfather   . Hypertension Maternal Grandfather    Soc:  Social History   Social History  . Marital status: Married    Spouse name: N/A  . Number of children: N/A  . Years of education: N/A   Occupational History  . Not on file.   Social History Main Topics  . Smoking status: Former Smoker    Quit date: 06/24/2013  . Smokeless tobacco: Never Used  . Alcohol use No     Comment: occ  . Drug use: No  . Sexual activity: Yes    Birth control/ protection: None   Other Topics Concern  . Not on file   Social History Narrative  . No narrative on file     Review of Systems: no vaginal bleeding or cramping/contractions, no LOF, no nausea/vomiting. All other systems reviewed and are negative.  PE:   Vitals:   10/29/15 0917  BP: 113/66  Pulse: 82    Labs: CBC    Component Value Date/Time   WBC 5.6 10/23/2015 1624  WBC 5.3 05/20/2015 1436   RBC 4.01 10/23/2015 1624   RBC 3.96 05/20/2015 1436   HGB 11.6 (L) 05/20/2015 1436   HCT 32.5 (L) 10/23/2015 1624   PLT 157 10/23/2015 1624   MCV 81 10/23/2015 1624   MCH 27.2 10/23/2015 1624   MCH 29.3 05/20/2015 1436   MCHC 33.5 10/23/2015 1624   MCHC 35.5 05/20/2015 1436   RDW 13.9 10/23/2015 1624   LYMPHSABS 1.0 10/11/2015 1158   MONOABS 0.3 10/18/2009 0325   EOSABS 0.2 10/11/2015 1158   BASOSABS 0.0 10/11/2015 1158     A/P: 1) Single IUP at 37w 6d  2) Mild thrombocytopenia - prior labs showed mild thrombocytopenia in the 140 range; however, her most recent platelet count was 157.  I would not anticipate any issues - she should be able to get an epidural.  Would recommend  routine OB care.  Thank you for the opportunity to be a part of the care of Sonya Foster. Please contact our office if we can be of further assistance.   I spent approximately 30 minutes with this patient with over 50% of time spent in face-to-face counseling.  Alpha Gula, MD Maternal Fetal Medicine

## 2015-10-30 ENCOUNTER — Ambulatory Visit (INDEPENDENT_AMBULATORY_CARE_PROVIDER_SITE_OTHER): Payer: Medicaid Other | Admitting: Certified Nurse Midwife

## 2015-10-30 VITALS — BP 111/80 | HR 80 | Temp 98.4°F | Wt 176.0 lb

## 2015-10-30 DIAGNOSIS — Z3483 Encounter for supervision of other normal pregnancy, third trimester: Secondary | ICD-10-CM

## 2015-10-30 DIAGNOSIS — Z23 Encounter for immunization: Secondary | ICD-10-CM | POA: Diagnosis not present

## 2015-10-30 MED ORDER — TETANUS-DIPHTH-ACELL PERTUSSIS 5-2.5-18.5 LF-MCG/0.5 IM SUSP
0.5000 mL | Freq: Once | INTRAMUSCULAR | Status: AC
  Administered 2015-10-30: 0.5 mL via INTRAMUSCULAR

## 2015-10-30 NOTE — Progress Notes (Addendum)
Subjective:    Sonya Foster is a 25 y.o. female being seen today for her obstetrical visit. She is at 713w0d gestation. Patient reports no complaints. Fetal movement: normal.  Problem List Items Addressed This Visit      Other   Encounter for supervision of other normal pregnancy in second trimester - Primary    Other Visit Diagnoses   None.    Patient Active Problem List   Diagnosis Date Noted  . GBS (group B Streptococcus carrier), +RV culture, currently pregnant 09/30/2015  . Encounter for supervision of other normal pregnancy in second trimester 07/05/2015    Objective:    BP 111/80   Pulse 80   Temp 98.4 F (36.9 C)   Wt 176 lb (79.8 kg)   LMP 02/08/2015   BMI 31.18 kg/m  FHT: 140 BPM  Uterine Size: 39 cm and size equals dates  Presentations: cephalic  Pelvic Exam:              Dilation: 2cm       Effacement: 25%             Station:  -3    Consistency: medium            Position: posterior     Assessment:    Pregnancy @ 533w0d weeks   Plan:    TDaP & Flu vacienes today Plans for delivery: Vaginal anticipated; labs reviewed; problem list updated Counseling: Consent signed. Infant feeding: plans to breastfeed. Cigarette smoking: never smoked. L&D discussion: symptoms of labor, discussed when to call, discussed what number to call, anesthetic/analgesic options reviewed and delivering clinician:  plans Certified Nurse-Midwife. Postpartum supports and preparation: circumcision discussed and contraception plans discussed.  Follow up in 1 Week with NST and IOL if not delivered.

## 2015-10-30 NOTE — Patient Instructions (Addendum)
Before [facility] Before your baby arrives it is important to:  Have all of the supplies that you will need to care for your baby.  Know where to go if there is an emergency.  Discuss the baby's arrival with other family members. WHAT SUPPLIES WILL I NEED? It is recommended that you have the following supplies: Large Items  Crib.  Crib mattress.  Rear-facing infant car seat. If possible, have a trained professional check to make sure that it is installed correctly. Feeding  6-8 bottles that are 4-5 oz in size.  6-8 nipples.  Bottle brush.  Sterilizer, or a large pan or kettle with a lid.  A way to boil and cool water.  If you will be breastfeeding:  Breast pump.  Nipple cream.  Nursing bra.  Breast pads.  Breast shields.  If you will be formula feeding:  Formula.  Measuring cups.  Measuring spoons. Bathing  Mild baby soap and baby shampoo.  Petroleum jelly.  Soft cloth towel and washcloth.  Hooded towel.  Cotton balls.  Bath basin. Other Supplies  Rectal thermometer.  Bulb syringe.  Baby wipes or washcloths for diaper changes.  Diaper bag.  Changing pad.  Clothing, including one-piece outfits and pajamas.  Baby nail clippers.  Receiving blankets.  Mattress pad and sheets for the crib.  Night-light for the baby's room.  Baby monitor.  2 or 3 pacifiers.  Either 24-36 cloth diapers and waterproof diaper covers or a box of disposable diapers. You may need to use as many as 10-12 diapers per day. HOW DO I PREPARE FOR AN EMERGENCY? Prepare for an emergency by:  Knowing how to get to the nearest hospital.  Listing the phone numbers of your baby's health care providers near your home phone and in your cell phone. HOW DO I PREPARE MY FAMILY?  Decide how to handle visitors.  If you have other children:  Talk with them about the baby coming home. Ask them how they feel about it.  Read a book together about being a new big  brother or sister.  Find ways to let them help you prepare for the new baby.  Have someone ready to care for them while you are in the hospital.   This information is not intended to replace advice given to you by your health care provider. Make sure you discuss any questions you have with your health care provider.   Document Released: 12/19/2007 Document Revised: 05/22/2014 Document Reviewed: 12/13/2013 Elsevier Interactive Patient Education [employer] Third Trimester of Pregnancy The third trimester is from week 29 through week 42, months 7 through 9. The third trimester is a time when the fetus is growing rapidly. At the end of the ninth month, the fetus is about 20 inches in length and weighs 6-10 pounds.  BODY CHANGES Your body goes through many changes during pregnancy. The changes vary from woman to woman.   Your weight will continue to increase. You can expect to gain 25-35 pounds (11-16 kg) by the end of the pregnancy.  You may begin to get stretch marks on your hips, abdomen, and breasts.  You may urinate more often because the fetus is moving lower into your pelvis and pressing on your bladder.  You may develop or continue to have heartburn as a result of your pregnancy.  You may develop constipation because certain hormones are causing the muscles that push waste through your intestines to slow down.  You may develop hemorrhoids or  swollen, bulging veins (varicose veins).  You may have pelvic pain because of the weight gain and pregnancy hormones relaxing your joints between the bones in your pelvis. Backaches may result from overexertion of the muscles supporting your posture.  You may have changes in your hair. These can include thickening of your hair, rapid growth, and changes in texture. Some women also have hair loss during or after pregnancy, or hair that feels dry or thin. Your hair will most likely return to normal after your baby is born.  Your breasts  will continue to grow and be tender. A yellow discharge may leak from your breasts called colostrum.  Your belly button may stick out.  You may feel short of breath because of your expanding uterus.  You may notice the fetus "dropping," or moving lower in your abdomen.  You may have a bloody mucus discharge. This usually occurs a few days to a week before labor begins.  Your cervix becomes thin and soft (effaced) near your due date. WHAT TO EXPECT AT YOUR PRENATAL EXAMS  You will have prenatal exams every 2 weeks until week 36. Then, you will have weekly prenatal exams. During a routine prenatal visit:  You will be weighed to make sure you and the fetus are growing normally.  Your blood pressure is taken.  Your abdomen will be measured to track your baby's growth.  The fetal heartbeat will be listened to.  Any test results from the previous visit will be discussed.  You may have a cervical check near your due date to see if you have effaced. At around 36 weeks, your caregiver will check your cervix. At the same time, your caregiver will also perform a test on the secretions of the vaginal tissue. This test is to determine if a type of bacteria, Group B streptococcus, is present. Your caregiver will explain this further. Your caregiver may ask you:  What your birth plan is.  How you are feeling.  If you are feeling the baby move.  If you have had any abnormal symptoms, such as leaking fluid, bleeding, severe headaches, or abdominal cramping.  If you are using any tobacco products, including cigarettes, chewing tobacco, and electronic cigarettes.  If you have any questions. Other tests or screenings that may be performed during your third trimester include:  Blood tests that check for low iron levels (anemia).  Fetal testing to check the health, activity level, and growth of the fetus. Testing is done if you have certain medical conditions or if there are problems during the  pregnancy.  HIV (human immunodeficiency virus) testing. If you are at high risk, you may be screened for HIV during your third trimester of pregnancy. FALSE LABOR You may feel small, irregular contractions that eventually go away. These are called Braxton Hicks contractions, or false labor. Contractions may last for hours, days, or even weeks before true labor sets in. If contractions come at regular intervals, intensify, or become painful, it is best to be seen by your caregiver.  SIGNS OF LABOR   Menstrual-like cramps.  Contractions that are 5 minutes apart or less.  Contractions that start on the top of the uterus and spread down to the lower abdomen and back.  A sense of increased pelvic pressure or back pain.  A watery or bloody mucus discharge that comes from the vagina. If you have any of these signs before the 37th week of pregnancy, call your caregiver right away. You need to go to  the hospital to get checked immediately. HOME CARE INSTRUCTIONS   Avoid all smoking, herbs, alcohol, and unprescribed drugs. These chemicals affect the formation and growth of the baby.  Do not use any tobacco products, including cigarettes, chewing tobacco, and electronic cigarettes. If you need help quitting, ask your health care provider. You may receive counseling support and other resources to help you quit.  Follow your caregiver's instructions regarding medicine use. There are medicines that are either safe or unsafe to take during pregnancy.  Exercise only as directed by your caregiver. Experiencing uterine cramps is a good sign to stop exercising.  Continue to eat regular, healthy meals.  Wear a good support bra for breast tenderness.  Do not use hot tubs, steam rooms, or saunas.  Wear your seat belt at all times when driving.  Avoid raw meat, uncooked cheese, cat litter boxes, and soil used by cats. These carry germs that can cause birth defects in the baby.  Take your prenatal  vitamins.  Take 1500-2000 mg of calcium daily starting at the 20th week of pregnancy until you deliver your baby.  Try taking a stool softener (if your caregiver approves) if you develop constipation. Eat more high-fiber foods, such as fresh vegetables or fruit and whole grains. Drink plenty of fluids to keep your urine clear or pale yellow.  Take warm sitz baths to soothe any pain or discomfort caused by hemorrhoids. Use hemorrhoid cream if your caregiver approves.  If you develop varicose veins, wear support hose. Elevate your feet for 15 minutes, 3-4 times a day. Limit salt in your diet.  Avoid heavy lifting, wear low heal shoes, and practice good posture.  Rest a lot with your legs elevated if you have leg cramps or low back pain.  Visit your dentist if you have not gone during your pregnancy. Use a soft toothbrush to brush your teeth and be gentle when you floss.  A sexual relationship may be continued unless your caregiver directs you otherwise.  Do not travel far distances unless it is absolutely necessary and only with the approval of your caregiver.  Take prenatal classes to understand, practice, and ask questions about the labor and delivery.  Make a trial run to the hospital.  Pack your hospital bag.  Prepare the baby's nursery.  Continue to go to all your prenatal visits as directed by your caregiver. SEEK MEDICAL CARE IF:  You are unsure if you are in labor or if your water has broken.  You have dizziness.  You have mild pelvic cramps, pelvic pressure, or nagging pain in your abdominal area.  You have persistent nausea, vomiting, or diarrhea.  You have a bad smelling vaginal discharge.  You have pain with urination. SEEK IMMEDIATE MEDICAL CARE IF:   You have a fever.  You are leaking fluid from your vagina.  You have spotting or bleeding from your vagina.  You have severe abdominal cramping or pain.  You have rapid weight loss or gain.  You have  shortness of breath with chest pain.  You notice sudden or extreme swelling of your face, hands, ankles, feet, or legs.  You have not felt your baby move in over an hour.  You have severe headaches that do not go away with medicine.  You have vision changes.   This information is not intended to replace advice given to you by your health care provider. Make sure you discuss any questions you have with your health care provider.   Document  Released: 12/30/2000 Document Revised: 01/26/2014 Document Reviewed: 03/08/2012 Elsevier Interactive Patient Education 2016 [employer]  Vaginal Delivery During delivery, your health care provider will help you give birth to your baby. During a vaginal delivery, you will work to push the baby out of your vagina. However, before you can push your baby out, a few things need to happen. The opening of your uterus (cervix) has to soften, thin out, and open up (dilate) all the way to 10 cm. Also, your baby has to move down from the uterus into your vagina.  SIGNS OF LABOR  Your health care provider will first need to make sure you are in labor. Signs of labor include:   Passing what is called the mucous plug before labor begins. This is a small amount of blood-stained mucus.  Having regular, painful uterine contractions.   The time between contractions gets shorter.   The discomfort and pain gradually get more intense.  Contraction pains get worse when walking and do not go away when resting.   Your cervix becomes thinner (effacement) and dilates. BEFORE THE DELIVERY Once you are in labor and admitted into the hospital or care center, your health care provider may do the following:   Perform a complete physical exam.  Review any complications related to pregnancy or labor.  Check your blood pressure, pulse, temperature, and heart rate (vital signs).   Determine if, and when, the rupture of amniotic membranes occurred.  Do a vaginal exam  (using a sterile glove and lubricant) to determine:   The position (presentation) of the baby. Is the baby's head presenting first (vertex) in the birth canal (vagina), or are the feet or buttocks first (breech)?   The level (station) of the baby's head within the birth canal.   The effacement and dilatation of the cervix.   An electronic fetal monitor is usually placed on your abdomen when you first arrive. This is used to monitor your contractions and the baby's heart rate.  When the monitor is on your abdomen (external fetal monitor), it can only pick up the frequency and length of your contractions. It cannot tell the strength of your contractions.  If it becomes necessary for your health care provider to know exactly how strong your contractions are or to see exactly what the baby's heart rate is doing, an internal monitor may be inserted into your vagina and uterus. Your health care provider will discuss the benefits and risks of using an internal monitor and obtain your permission before inserting the device.  Continuous fetal monitoring may be needed if you have an epidural, are receiving certain medicines (such as oxytocin), or have pregnancy or labor complications.  An IV access tube may be placed into a vein in your arm to deliver fluids and medicines if necessary. THREE STAGES OF LABOR AND DELIVERY Normal labor and delivery is divided into three stages. First Stage This stage starts when you begin to contract regularly and your cervix begins to efface and dilate. It ends when your cervix is completely open (fully dilated). The first stage is the longest stage of labor and can last from 3 hours to 15 hours.  Several methods are available to help with labor pain. You and your health care provider will decide which option is best for you. Options include:   Opioid medicines. These are strong pain medicines that you can get through your IV tube or as a shot into your muscle. These  medicines lessen pain but do  not make it go away completely.  Epidural. A medicine is given through a thin tube that is inserted in your back. The medicine numbs the lower part of your body and prevents any pain in that area.  Paracervical pain medicine. This is an injection of an anesthetic on each side of your cervix.   You may request natural childbirth, which does not involve the use of pain medicines or an epidural during labor and delivery. Instead, you will use other things, such as breathing exercises, to help cope with the pain. Second Stage The second stage of labor begins when your cervix is fully dilated at 10 cm. It continues until you push your baby down through the birth canal and the baby is born. This stage can take only minutes or several hours.  The location of your baby's head as it moves through the birth canal is reported as a number called a station. If the baby's head has not started its descent, the station is described as being at minus 3 (-3). When your baby's head is at the zero station, it is at the middle of the birth canal and is engaged in the pelvis. The station of your baby helps indicate the progress of the second stage of labor.  When your baby is born, your health care provider may hold the baby with his or her head lowered to prevent amniotic fluid, mucus, and blood from getting into the baby's lungs. The baby's mouth and nose may be suctioned with a small bulb syringe to remove any additional fluid.  Your health care provider may then place the baby on your stomach. It is important to keep the baby from getting cold. To do this, the health care provider will dry the baby off, place the baby directly on your skin (with no blankets between you and the baby), and cover the baby with warm, dry blankets.   The umbilical cord is cut. Third Stage During the third stage of labor, your health care provider will deliver the placenta (afterbirth) and make sure your  bleeding is under control. The delivery of the placenta usually takes about 5 minutes but can take up to 30 minutes. After the placenta is delivered, a medicine may be given either by IV or injection to help contract the uterus and control bleeding. If you are planning to breastfeed, you can try to do so now. After you deliver the placenta, your uterus should contract and get very firm. If your uterus does not remain firm, your health care provider will massage it. This is important because the contraction of the uterus helps cut off bleeding at the site where the placenta was attached to your uterus. If your uterus does not contract properly and stay firm, you may continue to bleed heavily. If there is a lot of bleeding, medicines may be given to contract the uterus and stop the bleeding.    This information is not intended to replace advice given to you by your health care provider. Make sure you discuss any questions you have with your health care provider.   Document Released: 10/15/2007 Document Revised: 01/26/2014 Document Reviewed: 09/02/2011 Elsevier Interactive Patient Education [employer] Inc[employer]

## 2015-10-30 NOTE — Addendum Note (Signed)
Addended by: Francene FindersJAMES, Srija Southard C on: 10/30/2015 04:32 PM   Modules accepted: Orders

## 2015-11-05 ENCOUNTER — Inpatient Hospital Stay (HOSPITAL_COMMUNITY)
Admission: AD | Admit: 2015-11-05 | Discharge: 2015-11-08 | DRG: 774 | Disposition: A | Discharge status: Home / Self Care | Payer: Medicaid Other | Source: Ambulatory Visit | Attending: Obstetrics & Gynecology | Admitting: Obstetrics & Gynecology

## 2015-11-05 ENCOUNTER — Telehealth: Payer: Self-pay | Admitting: *Deleted

## 2015-11-05 ENCOUNTER — Encounter (HOSPITAL_COMMUNITY): Payer: Self-pay | Admitting: *Deleted

## 2015-11-05 DIAGNOSIS — Z87891 Personal history of nicotine dependence: Secondary | ICD-10-CM

## 2015-11-05 DIAGNOSIS — O99824 Streptococcus B carrier state complicating childbirth: Secondary | ICD-10-CM | POA: Diagnosis present

## 2015-11-05 DIAGNOSIS — O9832 Other infections with a predominantly sexual mode of transmission complicating childbirth: Secondary | ICD-10-CM | POA: Diagnosis present

## 2015-11-05 DIAGNOSIS — O4202 Full-term premature rupture of membranes, onset of labor within 24 hours of rupture: Principal | ICD-10-CM | POA: Diagnosis present

## 2015-11-05 DIAGNOSIS — Z8249 Family history of ischemic heart disease and other diseases of the circulatory system: Secondary | ICD-10-CM

## 2015-11-05 DIAGNOSIS — Z3A39 39 weeks gestation of pregnancy: Secondary | ICD-10-CM

## 2015-11-05 DIAGNOSIS — A6000 Herpesviral infection of urogenital system, unspecified: Secondary | ICD-10-CM | POA: Diagnosis present

## 2015-11-05 DIAGNOSIS — Z833 Family history of diabetes mellitus: Secondary | ICD-10-CM

## 2015-11-05 LAB — POCT FERN TEST: POCT FERN TEST: POSITIVE

## 2015-11-05 MED ORDER — LACTATED RINGERS IV SOLN: INTRAVENOUS | Status: DC

## 2015-11-05 NOTE — H&P (Signed)
LABOR AND DELIVERY ADMISSION HISTORY AND PHYSICAL NOTE  Sonya Foster is a 25 y.o. female [phone] with IUP at [redacted]w[redacted]d by LMP confirmed with 23 week [place] presenting for SROM.   Felt gush of fluid around 6:30pm this evening. She reports positive fetal movement. She denies vaginal bleeding.   Prenatal History/Complications:  Past Medical History: Past Medical History:  Diagnosis Date  . Abnormal Pap smear   . Genital HSV   . History of chlamydia   . Late prenatal care     Past Surgical History: History reviewed. No pertinent surgical history.  Obstetrical History: OB History    Gravida Para Term Preterm AB Living   4 3 3     3    SAB TAB Ectopic Multiple Live Births           3     Social History: Social History   Social History  . Marital status: Married    Spouse name: N/A  . Number of children: N/A  . Years of education: N/A   Social History Main Topics  . Smoking status: Former Smoker    Quit date: 06/24/2013  . Smokeless tobacco: Never Used  . Alcohol use No     Comment: occ  . Drug use: No  . Sexual activity: Yes    Birth control/ protection: None   Other Topics Concern  . None   Social History Narrative  . None   Family History: Family History  Problem Relation Age of Onset  . Diabetes Maternal Grandmother   . Hypertension Maternal Grandmother   . Diabetes Maternal Grandfather   . Hypertension Maternal Grandfather     Allergies: No Known Allergies  Prescriptions Prior to Admission  Medication Sig Dispense Refill Last Dose  . acetaminophen (TYLENOL) 325 MG tablet Take 2 tablets (650 mg total) by mouth every 6 (six) hours as needed for moderate pain. (Patient not taking: Reported on 10/29/2015) 30 tablet 0 Not Taking  . amoxicillin-clavulanate (AUGMENTIN) 875-125 MG tablet Take 1 tablet by mouth 2 (two) times daily. (Patient not taking: Reported on 10/29/2015) 14 tablet 0 Not Taking  . cyclobenzaprine (FLEXERIL) 5 MG tablet Take 1 tablet (5 mg total)  by mouth 3 (three) times daily as needed for muscle spasms. (Patient not taking: Reported on 10/29/2015) 10 tablet 0 Not Taking  . Prenatal Vit-Fe Fumarate-FA (PRENATAL MULTIVITAMIN) TABS tablet Take 1 tablet by mouth at bedtime.    Not Taking  . Prenatal Vit-Fe Phos-FA-Omega (VITAFOL GUMMIES) 3.33-0.333-34.8 MG CHEW Chew 3 tablets by mouth daily. 90 tablet 12 Taking  . promethazine (PHENERGAN) 12.5 MG tablet Take 1 tablet (12.5 mg total) by mouth every 6 (six) hours as needed for nausea or vomiting. 30 tablet 0 Taking  . valACYclovir (VALTREX) 500 MG tablet Take 1 tablet (500 mg total) by mouth daily. Take until after delivery for suppression. 30 tablet prn Taking    Review of Systems   All systems reviewed and negative except as stated in HPI  Blood pressure 127/82, pulse 83, temperature 97.9 F (36.6 C), temperature source Oral, resp. rate 15, height 5\' 3"  (1.6 m), weight 79.8 kg (176 lb), last menstrual period 02/08/2015. General appearance: alert, cooperative and no distress Lungs: no respiratory distress Heart: regular rate Abdomen: soft, non-tender Extremities: No calf swelling or tenderness Presentation: cephalic by US Fetal monitoring: baseline 135, moderate variability, accelerations present no decelerations Uterine activity: every 2 minutes   Prenatal labs: ABO, Rh: B/Positive/-- (09/22 1158) Antibody: Negative (09/22 1158) Rubella: 4.88  immune RPR: Non Reactive (09/22 1158)  HBsAg: Negative (09/22 1158)  HIV: Non Reactive (07/28 1110)  GBS:   Postive  Prenatal Transfer Tool  Maternal Diabetes: No Genetic Screening: Declined Maternal Ultrasounds/Referrals: Normal Fetal Ultrasounds or other Referrals:  Referred to Materal Fetal Medicine  Maternal Substance Abuse:  No Significant Maternal Medications:  Meds include: Other:  valtrex Significant Maternal Lab Results: None  Results for orders placed or performed during the hospital encounter of 11/05/15 (from the past 24  hour(s))  [facility]   Collection Time: 11/05/15 11:26 PM  Result Value Ref Range   POCT Fern Test Positive = ruptured amniotic membanes     Patient Active Problem List   Diagnosis Date Noted  . GBS (group B Streptococcus carrier), +RV culture, currently pregnant 09/30/2015  . Encounter for supervision of other normal pregnancy in second trimester 07/05/2015    Assessment: Sonya Foster is a 25 y.o. [id number] at 3529w6d here for SROM  #Labor: SROM, expectant management check when pain inreases #Pain: Plan for epidural #FWB: Category 1 tracing #ID:  GBS positive #MOF: breast #MOC:Mirena #Circ:  Yes, outpatient  Tillman SersAngela C Riccio, DO PGY-1 10/17/201711:42 PM  OB FELLOW HISTORY AND PHYSICAL ATTESTATION  I have seen and examined this patient; I agree with above documentation in the resident's note.    Ernestina Pennaicholas Shiron Whetsel 11/06/2015, 12:17 AM

## 2015-11-05 NOTE — Telephone Encounter (Signed)
Patient has questions about her mucus plug. 10/17 9:35 Patient called in- spoke to patient regarding mucus plug, signs of labor, delivery plan, induction and she will keep her appointment on Thursday.

## 2015-11-06 ENCOUNTER — Inpatient Hospital Stay (HOSPITAL_COMMUNITY): Payer: Medicaid Other | Admitting: Anesthesiology

## 2015-11-06 ENCOUNTER — Encounter (HOSPITAL_COMMUNITY): Payer: Self-pay

## 2015-11-06 DIAGNOSIS — O9832 Other infections with a predominantly sexual mode of transmission complicating childbirth: Secondary | ICD-10-CM | POA: Diagnosis present

## 2015-11-06 DIAGNOSIS — Z833 Family history of diabetes mellitus: Secondary | ICD-10-CM | POA: Diagnosis not present

## 2015-11-06 DIAGNOSIS — Z3A39 39 weeks gestation of pregnancy: Secondary | ICD-10-CM

## 2015-11-06 DIAGNOSIS — Z87891 Personal history of nicotine dependence: Secondary | ICD-10-CM | POA: Diagnosis not present

## 2015-11-06 DIAGNOSIS — A6000 Herpesviral infection of urogenital system, unspecified: Secondary | ICD-10-CM | POA: Diagnosis present

## 2015-11-06 DIAGNOSIS — Z8249 Family history of ischemic heart disease and other diseases of the circulatory system: Secondary | ICD-10-CM | POA: Diagnosis not present

## 2015-11-06 DIAGNOSIS — O99824 Streptococcus B carrier state complicating childbirth: Secondary | ICD-10-CM | POA: Diagnosis present

## 2015-11-06 DIAGNOSIS — O4202 Full-term premature rupture of membranes, onset of labor within 24 hours of rupture: Secondary | ICD-10-CM | POA: Diagnosis present

## 2015-11-06 LAB — TYPE AND SCREEN
ABO/RH(D): B POS
Antibody Screen: NEGATIVE

## 2015-11-06 LAB — CBC
HEMATOCRIT: 34.7 % — AB (ref 36.0–46.0)
Hemoglobin: 12 g/dL (ref 12.0–15.0)
MCH: 27.6 pg (ref 26.0–34.0)
MCHC: 34.6 g/dL (ref 30.0–36.0)
MCV: 80 fL (ref 78.0–100.0)
Platelets: 167 10*3/uL (ref 150–400)
RBC: 4.34 MIL/uL (ref 3.87–5.11)
RDW: 14 % (ref 11.5–15.5)
WBC: 6 10*3/uL (ref 4.0–10.5)

## 2015-11-06 LAB — ABO/RH: ABO/RH(D): B POS

## 2015-11-06 MED ORDER — DIPHENHYDRAMINE HCL 25 MG PO CAPS: 25.0000 mg | ORAL_CAPSULE | Freq: Four times a day (QID) | ORAL | Status: DC | PRN

## 2015-11-06 MED ORDER — BENZOCAINE-MENTHOL 20-0.5 % EX AERO: 1.0000 "application " | INHALATION_SPRAY | CUTANEOUS | Status: DC | PRN

## 2015-11-06 MED ORDER — PHENYLEPHRINE 40 MCG/ML (10ML) SYRINGE FOR IV PUSH (FOR BLOOD PRESSURE SUPPORT)
80.0000 ug | PREFILLED_SYRINGE | INTRAVENOUS | Status: DC | PRN
  Filled 2015-11-06: qty 5

## 2015-11-06 MED ORDER — TERBUTALINE SULFATE 1 MG/ML IJ SOLN
0.2500 mg | Freq: Once | INTRAMUSCULAR | Status: DC | PRN
  Filled 2015-11-06: qty 1

## 2015-11-06 MED ORDER — COCONUT OIL OIL
1.0000 "application " | TOPICAL_OIL | Status: DC | PRN
  Administered 2015-11-08: 1 via TOPICAL
  Filled 2015-11-06: qty 120

## 2015-11-06 MED ORDER — TETANUS-DIPHTH-ACELL PERTUSSIS 5-2.5-18.5 LF-MCG/0.5 IM SUSP: 0.5000 mL | Freq: Once | INTRAMUSCULAR | Status: DC

## 2015-11-06 MED ORDER — ACETAMINOPHEN 325 MG PO TABS
650.0000 mg | ORAL_TABLET | ORAL | Status: DC | PRN
Start: 2015-11-06 — End: 2015-11-08
  Administered 2015-11-06: 650 mg via ORAL
  Filled 2015-11-06: qty 2

## 2015-11-06 MED ORDER — FENTANYL 2.5 MCG/ML BUPIVACAINE 1/10 % EPIDURAL INFUSION (WH - ANES)
INTRAMUSCULAR | Status: AC
  Filled 2015-11-06: qty 125

## 2015-11-06 MED ORDER — LACTATED RINGERS IV SOLN: 500.0000 mL | INTRAVENOUS | Status: DC | PRN

## 2015-11-06 MED ORDER — LIDOCAINE HCL (PF) 1 % IJ SOLN
INTRAMUSCULAR | Status: DC | PRN
  Administered 2015-11-06: 6 mL via EPIDURAL
  Administered 2015-11-06: 4 mL via EPIDURAL

## 2015-11-06 MED ORDER — EPHEDRINE 5 MG/ML INJ
10.0000 mg | INTRAVENOUS | Status: DC | PRN
Start: 2015-11-06 — End: 2015-11-06
  Filled 2015-11-06: qty 4

## 2015-11-06 MED ORDER — OXYTOCIN 40 UNITS IN LACTATED RINGERS INFUSION - SIMPLE MED
1.0000 m[IU]/min | INTRAVENOUS | Status: DC
  Administered 2015-11-06: 2 m[IU]/min via INTRAVENOUS
  Filled 2015-11-06: qty 1000

## 2015-11-06 MED ORDER — SOD CITRATE-CITRIC ACID 500-334 MG/5ML PO SOLN: 30.0000 mL | ORAL | Status: DC | PRN

## 2015-11-06 MED ORDER — IBUPROFEN 600 MG PO TABS
600.0000 mg | ORAL_TABLET | Freq: Four times a day (QID) | ORAL | Status: DC
  Administered 2015-11-06 – 2015-11-08 (×8): 600 mg via ORAL
  Filled 2015-11-06 (×10): qty 1

## 2015-11-06 MED ORDER — OXYCODONE-ACETAMINOPHEN 5-325 MG PO TABS: 2.0000 | ORAL_TABLET | ORAL | Status: DC | PRN

## 2015-11-06 MED ORDER — ONDANSETRON HCL 4 MG PO TABS: 4.0000 mg | ORAL_TABLET | ORAL | Status: DC | PRN

## 2015-11-06 MED ORDER — WITCH HAZEL-GLYCERIN EX PADS: 1.0000 "application " | MEDICATED_PAD | CUTANEOUS | Status: DC | PRN

## 2015-11-06 MED ORDER — SIMETHICONE 80 MG PO CHEW: 80.0000 mg | CHEWABLE_TABLET | ORAL | Status: DC | PRN

## 2015-11-06 MED ORDER — LIDOCAINE HCL (PF) 1 % IJ SOLN
30.0000 mL | INTRAMUSCULAR | Status: DC | PRN
  Filled 2015-11-06: qty 30

## 2015-11-06 MED ORDER — PENICILLIN G POTASSIUM 5000000 UNITS IJ SOLR
2.5000 10*6.[IU] | INTRAVENOUS | Status: DC
  Administered 2015-11-06: 2.5 10*6.[IU] via INTRAVENOUS
  Filled 2015-11-06 (×3): qty 2.5

## 2015-11-06 MED ORDER — DIPHENHYDRAMINE HCL 50 MG/ML IJ SOLN
12.5000 mg | INTRAMUSCULAR | Status: DC | PRN
  Administered 2015-11-06: 12.5 mg via INTRAVENOUS
  Filled 2015-11-06: qty 1

## 2015-11-06 MED ORDER — OXYCODONE-ACETAMINOPHEN 5-325 MG PO TABS: 1.0000 | ORAL_TABLET | ORAL | Status: DC | PRN

## 2015-11-06 MED ORDER — LACTATED RINGERS IV SOLN: 500.0000 mL | Freq: Once | INTRAVENOUS | Status: DC

## 2015-11-06 MED ORDER — DEXTROSE 5 % IV SOLN
5.0000 10*6.[IU] | Freq: Once | INTRAVENOUS | Status: AC
  Administered 2015-11-06: 5 10*6.[IU] via INTRAVENOUS
  Filled 2015-11-06: qty 5

## 2015-11-06 MED ORDER — ONDANSETRON HCL 4 MG/2ML IJ SOLN: 4.0000 mg | Freq: Four times a day (QID) | INTRAMUSCULAR | Status: DC | PRN

## 2015-11-06 MED ORDER — DIPHENHYDRAMINE HCL 50 MG/ML IJ SOLN
12.5000 mg | INTRAMUSCULAR | Status: DC | PRN
  Administered 2015-11-06 (×2): 12.5 mg via INTRAVENOUS
  Filled 2015-11-06: qty 1

## 2015-11-06 MED ORDER — ZOLPIDEM TARTRATE 5 MG PO TABS: 5.0000 mg | ORAL_TABLET | Freq: Every evening | ORAL | Status: DC | PRN

## 2015-11-06 MED ORDER — OXYTOCIN 40 UNITS IN LACTATED RINGERS INFUSION - SIMPLE MED: 2.5000 [IU]/h | INTRAVENOUS | Status: DC

## 2015-11-06 MED ORDER — EPHEDRINE 5 MG/ML INJ
10.0000 mg | INTRAVENOUS | Status: DC | PRN
  Filled 2015-11-06: qty 4

## 2015-11-06 MED ORDER — FENTANYL 2.5 MCG/ML BUPIVACAINE 1/10 % EPIDURAL INFUSION (WH - ANES)
14.0000 mL/h | INTRAMUSCULAR | Status: DC | PRN
  Administered 2015-11-06: 14 mL/h via EPIDURAL

## 2015-11-06 MED ORDER — SENNOSIDES-DOCUSATE SODIUM 8.6-50 MG PO TABS
2.0000 | ORAL_TABLET | ORAL | Status: DC
  Administered 2015-11-06: 2 via ORAL
  Filled 2015-11-06 (×2): qty 2

## 2015-11-06 MED ORDER — FLEET ENEMA 7-19 GM/118ML RE ENEM: 1.0000 | ENEMA | RECTAL | Status: DC | PRN

## 2015-11-06 MED ORDER — PRENATAL MULTIVITAMIN CH
1.0000 | ORAL_TABLET | Freq: Every day | ORAL | Status: DC
  Administered 2015-11-06 – 2015-11-08 (×3): 1 via ORAL
  Filled 2015-11-06 (×3): qty 1

## 2015-11-06 MED ORDER — DIBUCAINE 1 % RE OINT: 1.0000 "application " | TOPICAL_OINTMENT | RECTAL | Status: DC | PRN

## 2015-11-06 MED ORDER — OXYTOCIN BOLUS FROM INFUSION: 500.0000 mL | Freq: Once | INTRAVENOUS | Status: DC

## 2015-11-06 MED ORDER — ONDANSETRON HCL 4 MG/2ML IJ SOLN: 4.0000 mg | INTRAMUSCULAR | Status: DC | PRN

## 2015-11-06 MED ORDER — PHENYLEPHRINE 40 MCG/ML (10ML) SYRINGE FOR IV PUSH (FOR BLOOD PRESSURE SUPPORT)
PREFILLED_SYRINGE | INTRAVENOUS | Status: AC
  Filled 2015-11-06: qty 20

## 2015-11-06 MED ORDER — ACETAMINOPHEN 325 MG PO TABS: 650.0000 mg | ORAL_TABLET | ORAL | Status: DC | PRN

## 2015-11-06 NOTE — Progress Notes (Signed)
Pt seen and evaluated. No complaints. Plan for epidural and start pitocin after epidural in place. Category 1 tracing. Cervix 5/8/-1.

## 2015-11-06 NOTE — Anesthesia Preprocedure Evaluation (Signed)
Anesthesia Evaluation  Patient identified by MRN, date of birth, ID band Patient awake    Reviewed: Allergy & Precautions, NPO status , Patient's Chart, lab work & pertinent test results  History of Anesthesia Complications Negative for: history of anesthetic complications  Airway Mallampati: III  TM Distance: >3 FB Neck ROM: Full    Dental  (+) Teeth Intact   Pulmonary former smoker,    Pulmonary exam normal breath sounds clear to auscultation       Cardiovascular negative cardio ROS   Rhythm:Regular Rate:Normal     Neuro/Psych negative neurological ROS     GI/Hepatic negative GI ROS, Neg liver ROS,   Endo/Other  negative endocrine ROS  Renal/GU negative Renal ROS     Musculoskeletal   Abdominal   Peds  Hematology negative hematology ROS (+)   Anesthesia Other Findings   Reproductive/Obstetrics (+) Pregnancy                             Anesthesia Physical Anesthesia Plan  ASA: II  Anesthesia Plan: Epidural   Post-op Pain Management:    Induction:   Airway Management Planned: Natural Airway  Additional Equipment:   Intra-op Plan:   Post-operative Plan:   Informed Consent: I have reviewed the patients History and Physical, chart, labs and discussed the procedure including the risks, benefits and alternatives for the proposed anesthesia with the patient or authorized representative who has indicated his/her understanding and acceptance.     Plan Discussed with:   Anesthesia Plan Comments: (I have discussed risks of neuraxial anesthesia including but not limited to infection, bleeding, nerve injury, back pain, headache, seizures, and failure of block. Patient denies bleeding disorders and is not currently anticoagulated. Labs have been reviewed. Risks and benefits discussed. All patient's questions answered.   Platelets 167)        Anesthesia Quick Evaluation

## 2015-11-06 NOTE — Anesthesia Procedure Notes (Signed)
Epidural Patient location during procedure: OB Start time: 11/06/2015 3:02 AM End time: 11/06/2015 3:07 AM  Staffing Anesthesiologist: Linton RumpALLAN, Shadrick Senne DICKERSON Performed: anesthesiologist   Preanesthetic Checklist Completed: patient identified, surgical consent, pre-op evaluation, timeout performed, IV checked, risks and benefits discussed and monitors and equipment checked  Epidural Patient position: sitting Prep: site prepped and draped and DuraPrep Patient monitoring: continuous pulse ox and blood pressure Approach: midline Location: L2-L3 Injection technique: LOR air  Needle:  Needle type: Tuohy  Needle gauge: 17 G Needle length: 9 cm and 9 Needle insertion depth: 5 cm cm Catheter type: closed end flexible Catheter size: 19 Gauge Catheter at skin depth: 10 cm Test dose: negative  Assessment Events: blood not aspirated, injection not painful, no injection resistance, negative IV test and no paresthesia  Additional Notes Epidural placed easily on first attempt.Reason for block:procedure for pain

## 2015-11-06 NOTE — Progress Notes (Signed)
Assisted infant to breast

## 2015-11-06 NOTE — Anesthesia Postprocedure Evaluation (Signed)
Anesthesia Post Note  Patient: Sonya Foster  Procedure(s) Performed: * No procedures listed *  Patient location during evaluation: Mother Baby Anesthesia Type: Epidural Level of consciousness: awake and alert Pain management: satisfactory to patient Vital Signs Assessment: post-procedure vital signs reviewed and stable Respiratory status: respiratory function stable Cardiovascular status: stable Postop Assessment: no headache, no backache, epidural receding, patient able to bend at knees, no signs of nausea or vomiting and adequate PO intake Anesthetic complications: no     Last Vitals:  Vitals:   11/06/15 0945 11/06/15 1355  BP: (!) 118/56 115/68  Pulse: 67 72  Resp: 18 18  Temp: 36.6 C 36.5 C    Last Pain:  Vitals:   11/06/15 1355  TempSrc: Oral  PainSc:    Pain Goal: Patients Stated Pain Goal: 4 (11/06/15 1157)               [place]

## 2015-11-06 NOTE — Anesthesia Pain Management Evaluation Note (Signed)
  CRNA Pain Management Visit Note  Patient: Sonya Foster, 25 y.o., female  "Hello I am a member of the anesthesia team at [facility]. We have an anesthesia team available at all times to provide care throughout the hospital, including epidural management and anesthesia for C-section. I don't know your plan for the delivery whether it a natural birth, water birth, IV sedation, nitrous supplementation, doula or epidural, but we want to meet your pain goals."   1.Was your pain managed to your expectations on prior hospitalizations?   Yes   2.What is your expectation for pain management during this hospitalization?     Epidural  3.How can we help you reach that goal? Epidural placed and working well  Record the patient's initial score and the patient's pain goal.   Pain: 1  Pain Goal: 4 [facility] wants you to be able to say your pain was always managed very well.  Shima Compere 11/06/2015

## 2015-11-06 NOTE — Lactation Note (Signed)
This note was copied from a baby's chart. Lactation Consultation Note  Patient Name: Sonya Foster Reason for consult: Initial assessment Baby at 10 hr of life. Mom has large, soft breast with a short shaft nipple. She did not bf her oldest 2 children but bf the last baby 7269m. She would like to bf this baby 5025m. Mom reports the last bf was "really good". She denies breast or nipple pain. She feels like she needs help latching baby. At this feeding baby would take 1-2 sucks then fall asleep. Discussed baby behavior, feeding frequency, baby belly size, voids, wt loss, breast changes, and nipple care. She stated she can manually express and has spoon in room. Given Harmony to take home. She came back to the hospital at 4 days PP with her last child because her breast were "so engorged". Given lactation handouts. Aware of OP services and support group. She will call for help as needed.     Maternal Data Has patient been taught Hand Expression?: Yes Does the patient have breastfeeding experience prior to this delivery?: Yes  Feeding Feeding Type: Breast Fed Length of feed: 0 min  LATCH Score/Interventions Latch: Too sleepy or reluctant, no latch achieved, no sucking elicited.  Audible Swallowing: Spontaneous and intermittent Intervention(s): Skin to skin  Type of Nipple: Everted at rest and after stimulation  Comfort (Breast/Nipple): Soft / non-tender     Hold (Positioning): Assistance needed to correctly position infant at breast and maintain latch. Intervention(s): Support Pillows  LATCH Score: 9  Lactation Tools Discussed/Used WIC Program: Yes Pump Review: Setup, frequency, and cleaning;Milk Storage Initiated by:: ES Date initiated:: 11/06/15   Consult Status Consult Status: Follow-up Date: 11/07/15 Follow-up type: In-patient    Sonya Foster Foster, 5:06 PM

## 2015-11-07 ENCOUNTER — Encounter: Payer: Medicaid Other | Admitting: Certified Nurse Midwife

## 2015-11-07 LAB — RPR: RPR: NONREACTIVE

## 2015-11-07 MED ORDER — IBUPROFEN 600 MG PO TABS: 600.0000 mg | ORAL_TABLET | Freq: Four times a day (QID) | ORAL | 2 refills | Status: DC

## 2015-11-07 MED ORDER — OXYCODONE-ACETAMINOPHEN 5-325 MG PO TABS: 2.0000 | ORAL_TABLET | ORAL | 0 refills | Status: DC | PRN

## 2015-11-07 MED ORDER — SENNOSIDES-DOCUSATE SODIUM 8.6-50 MG PO TABS: 2.0000 | ORAL_TABLET | Freq: Two times a day (BID) | ORAL | 2 refills | Status: DC

## 2015-11-07 MED ORDER — COCONUT OIL OIL: 1.0000 "application " | TOPICAL_OIL | 99 refills | Status: DC | PRN

## 2015-11-07 MED ORDER — OXYCODONE-ACETAMINOPHEN 5-325 MG PO TABS
2.0000 | ORAL_TABLET | ORAL | Status: DC | PRN
  Administered 2015-11-07 (×2): 2 via ORAL
  Filled 2015-11-07 (×2): qty 2

## 2015-11-07 NOTE — Progress Notes (Signed)
UR chart review completed.  

## 2015-11-07 NOTE — Progress Notes (Signed)
Post Partum Day #1 Subjective: up ad lib, voiding, tolerating PO and reports severe pain with breast feeding d/t cramping.  Breast feeding help requested.   Objective: Blood pressure 124/82, pulse 70, temperature 97.5 F (36.4 C), temperature source Oral, resp. rate 18, height 5\' 3"  (1.6 m), weight 176 lb (79.8 kg), last menstrual period 02/08/2015, SpO2 100 %, unknown if currently breastfeeding.  Physical Exam:  General: alert, cooperative and no distress Lochia: appropriate Uterine Fundus: firm Incision: none DVT Evaluation: No evidence of DVT seen on physical exam. No cords or calf tenderness. No significant calf/ankle edema.   Recent Labs  11/05/15 2358  HGB 12.0  HCT 34.7*    Assessment/Plan: Plan for discharge tomorrow, Breastfeeding, Lactation consult and Contraception Mirena.     LOS: 1 day   Sonya Foster, CNM 11/07/2015, 8:36 AM

## 2015-11-07 NOTE — Discharge Summary (Signed)
Obstetric Discharge Summary Reason for Admission: rupture of membranes Prenatal Procedures: ultrasound Intrapartum Procedures: spontaneous vaginal delivery Postpartum Procedures: none Complications-Operative and Postpartum: none Hemoglobin  Date Value Ref Range Status  11/05/2015 12.0 12.0 - 15.0 g/dL Final   HCT  Date Value Ref Range Status  11/05/2015 34.7 (L) 36.0 - 46.0 % Final   Hematocrit  Date Value Ref Range Status  10/23/2015 32.5 (L) 34.0 - 46.6 % Final    Physical Exam:  General: alert, cooperative and no distress Lochia: appropriate Uterine Fundus: firm Incision: none DVT Evaluation: No evidence of DVT seen on physical exam. No cords or calf tenderness. No significant calf/ankle edema.  Discharge Diagnoses: Term Pregnancy-delivered  Discharge Information: Date: 11/07/2015 Activity: pelvic rest Diet: routine Medications: PNV, Ibuprofen, Colace, Iron and Percocet Condition: stable Instructions: refer to practice specific booklet Discharge to: home Follow-up Information    Rachelle A Denney, CNM Follow up in 2 week(s).   Specialty:  Certified Nurse Midwife Contact information: [address] [place] [address] [phone]           Newborn Data: Live born female  Birth Weight: 8 lb 3.2 oz (3719 g) APGAR: 9, 9  Home with mother.  Roe Coombsachelle A Denney, CNM 11/07/2015, 11:57 AM

## 2015-11-07 NOTE — Lactation Note (Addendum)
This note was copied from a baby's chart. Lactation Consultation Note  Patient Name: Sonya Foster ZOXWR'UToday's Date: 11/07/2015 Reason for consult: Follow-up assessment   Follow up with Exp BF mom of 34 hour old infant. Infant with 9 BF, 3 attempts, 4 voids and 3 stools in 24 hours preceding this assessment. LATCH Scores 8 by bedside RN's. Infant weight 7 lb 15.7 oz with weight loss of 3% since birth. Infant bilirubin 8.3 in High Intermediate risk zone.  Infant starting to stir and awakened easily. Assisted mom in latching himSTS to the left breast in the football hold. Colostrum in large gtts easily expressed prior to latch. He latched easily with flanged lips, rhythmic suckles and frequent swallows. Enc mom to compress breasts with feedings and swallows noted to increase. Infant fell asleep after 10 minutes, he was removed and burped and mom latched him to the right breast independently. He again fed for 10 minutes and fell asleep. Mom did well with stimulation and compression of breast throughout feeding. Infant left in dad's arms.   Enc mom to feed 8-12 x in 24 hours and to stimulate him as needed throughout feeding. Mom was pleased to see colostrum. She reports she does not feel a lot fuller today and reports infant has been feeding much better today. Mom with large pendulous breasts. Breasts are soft and nipples are erect. Colostrum very easy to express. She reports nipple tenderness with latch that improves with feeding. She is using EBM to nipples post BF. She is able to hand express. Discussed with mom that frequent feeds and increased output helps with jaundice, parents are aware that his levels have been rising.    Follow up tomorrow and PRN.      Maternal Data Formula Feeding for Exclusion: No Has patient been taught Hand Expression?: Yes Does the patient have breastfeeding experience prior to this delivery?: Yes  Feeding Feeding Type: Breast Fed Length of feed: 20 min  LATCH  Score/Interventions Latch: Grasps breast easily, tongue down, lips flanged, rhythmical sucking. Intervention(s): Skin to skin;Teach feeding cues;Waking techniques  Audible Swallowing: Spontaneous and intermittent  Type of Nipple: Everted at rest and after stimulation  Comfort (Breast/Nipple): Filling, red/small blisters or bruises, mild/mod discomfort  Problem noted: Mild/Moderate discomfort Interventions (Mild/moderate discomfort):  (Ebm to nipples post BF)  Hold (Positioning): Assistance needed to correctly position infant at breast and maintain latch. Intervention(s): Breastfeeding basics reviewed;Support Pillows;Position options;Skin to skin  LATCH Score: 8  Lactation Tools Discussed/Used     Consult Status Consult Status: Follow-up Date: 11/08/15 Follow-up type: In-patient    [name] 11/07/2015, 5:29 PM

## 2015-11-09 ENCOUNTER — Ambulatory Visit: Payer: Self-pay

## 2015-11-09 NOTE — Lactation Note (Signed)
This note was copied from a baby's chart. Lactation Consultation Note  Mother's breasts are filling and she is expressing pain related to this. Assisted her with therapeutic breast massage of lactation and use of the manual breast pump. Suggested she wear a supportive bra, Reviewed engorgement prevention.  Baby was hungry so he was attached and many swallows were heard. Mom also has sore nipples so a deeper latch was reviewed. She was reminded of support groups and outpatient services. Patient Name: Sonya Foster ZOXWR'UToday's Date: 11/09/2015 Reason for consult: Follow-up assessment   Maternal Data    Feeding Feeding Type: Breast Fed Length of feed: 10 min  LATCH Score/Interventions Latch: Grasps breast easily, tongue down, lips flanged, rhythmical sucking. Intervention(s): Skin to skin  Audible Swallowing: Spontaneous and intermittent  Type of Nipple: Everted at rest and after stimulation  Comfort (Breast/Nipple): Filling, red/small blisters or bruises, mild/mod discomfort  Problem noted: Mild/Moderate discomfort Interventions (Mild/moderate discomfort): Hand massage  Hold (Positioning): Assistance needed to correctly position infant at breast and maintain latch.  LATCH Score: 8  Lactation Tools Discussed/Used     Consult Status Consult Status: Complete    Sonya Foster, Sonya Foster 11/09/2015, 10:35 AM

## 2015-12-25 ENCOUNTER — Ambulatory Visit (INDEPENDENT_AMBULATORY_CARE_PROVIDER_SITE_OTHER): Payer: Medicaid Other | Admitting: Obstetrics & Gynecology

## 2015-12-25 ENCOUNTER — Encounter: Payer: Self-pay | Admitting: Obstetrics & Gynecology

## 2015-12-25 LAB — POCT URINALYSIS DIPSTICK
Bilirubin, UA: NEGATIVE
Blood, UA: NEGATIVE
GLUCOSE UA: NEGATIVE
Ketones, UA: NEGATIVE
LEUKOCYTES UA: NEGATIVE
NITRITE UA: NEGATIVE
Protein, UA: NEGATIVE
Spec Grav, UA: 1.01
UROBILINOGEN UA: NEGATIVE
pH, UA: 5

## 2015-12-25 NOTE — Progress Notes (Signed)
Patient is still having a lot of abdominal pain-cold makes it worse.

## 2015-12-25 NOTE — Progress Notes (Signed)
Subjective:     Sonya Foster is a 25 y.o. female who presents for a postpartum visit. She is 6 weeks postpartum following a spontaneous vaginal delivery. I have fully reviewed the prenatal and intrapartum course. The delivery was at 39.6 gestational weeks. Outcome: spontaneous vaginal delivery. Anesthesia: epidural. Postpartum course has been normal. Baby's course has been normal. Baby is feeding by breast. Bleeding no bleeding. Bowel function is normal. Bladder function is low abdominal pain. Patient is sexually active. Contraception method is IUD and requested. Postpartum depression screening: negative. Delivery Note At 6:24 AM a viable female was delivered via Vaginal, Spontaneous Delivery (Presentation: ROA ; vetex  ).  APGAR: 9 , 9 ; weight  pending.   Placenta status: delivered intact with gentle traction.  Cord: 3 vessel  with the following complications: none .    Anesthesia:  epidural Episiotomy:  none Lacerations:  none Est. Blood Loss (mL):  100  Mom to postpartum.  Baby to Couplet care / Skin to Skin.  Ernestina Pennaicholas Schenk 11/06/2015, 6:39 AM The following portions of the patient's history were reviewed and updated as appropriate: allergies, current medications, past family history, past medical history, past social history, past surgical history and problem list.  Review of Systems Pertinent items are noted in HPI.   Objective:    BP 125/82   Pulse 87   Wt 148 lb (67.1 kg)   Breastfeeding? Yes   BMI 26.22 kg/m   General:  alert, cooperative and no distress   Breasts:     Lungs:    Heart:     Abdomen: abnormal findings:  mild tenderness in the lower abdomen   Vulva:  normal  Vagina: normal vagina  Cervix:  no lesions  Corpus: normal  Adnexa:  no mass, fullness, tenderness  Rectal Exam: Not performed.        Assessment:     6 week postpartum exam. Pap smear not done at today's visit.   Plan:    1. Contraception: IUD and in 2 weeks 2. STD test and RTC for IUD  after abstinence 3. Follow up in: 2 weeks or as needed. F/U on today's labs  Adam PhenixJames G Gemayel Mascio, MD 12/25/2015

## 2015-12-25 NOTE — Patient Instructions (Signed)
Intrauterine Device Information Introduction An intrauterine device (IUD) is a medical device that gets inserted into the uterus to prevent pregnancy. It is a small, T-shaped device that has one or two nylon strings hanging down from it. The strings hang out of the lower part of the uterus (cervix) to allow for future IUD removal. There are two types of IUDs available:  Copper IUD. This type of IUD has copper wire wrapped around it. A copper IUD may last up to 10 years.  Hormone IUD. This type of IUD is made of plastic and contains the hormone progestin (synthetic progesterone). A hormone IUD may last 3 to 5 years. IUDs are inserted through the vagina and placed into the uterus with a minor medical procedure. How does the IUD work? Copper in the copper IUD prevents pregnancy by making the uterus and fallopian tubes produce a fluid that kills sperm. Synthetic progesterone in hormonal IUD prevents pregnancy by:  Thickening cervical mucus to prevent sperm from entering the uterus.  Thinning the uterine lining to prevent implantation of a fertilized egg.  Weakening or killing sperm that get into the uterus. What are the advantages of an IUD?  IUDs are highly effective, reversible, long-acting, and low-maintenance.  There are no estrogen-related side effects.  An IUD can be used when breastfeeding.  IUDs are not associated with weight gain.  Advantages of the copper IUD are that:  It works immediately after insertion.  It does not interfere with your body's natural hormones.  It can be used for 10 years.  Advantages of the hormone IUD are that:  If it is inserted within 7 days of your period starting, it works immediately after insertion. If the hormone IUD is inserted at any other time in your cycle, you will need to use a backup method of birth control for 7 days after insertion.  It can make menstrual periods lighter.  It can decrease menstrual cramping.  It can be used for 3  or 5 years. What are the disadvantages of an IUD?  The hormone IUD may cause irregular menstrual bleeding for a period of time after insertion.  The copper IUD can make your menstrual flow heavier and more painful.  You may experience some pain during insertion, and cramping and vaginal bleeding after insertion. How is the IUD removed? Is the IUD right for me?  Your health care provider will make sure you are a good candidate for an IUD and will discuss side effects with you. This information is not intended to replace advice given to you by your health care provider. Make sure you discuss any questions you have with your health care provider. Document Released: 12/10/2003 Document Revised: 06/13/2015 Document Reviewed: 06/26/2012  2017 Elsevier  

## 2015-12-28 LAB — NUSWAB VG+, CANDIDA 6SP
ATOPOBIUM VAGINAE: HIGH {score} — AB
BVAB 2: HIGH {score} — AB
CANDIDA KRUSEI, NAA: NEGATIVE
CANDIDA LUSITANIAE, NAA: NEGATIVE
Candida albicans, NAA: NEGATIVE
Candida glabrata, NAA: NEGATIVE
Candida parapsilosis, NAA: NEGATIVE
Candida tropicalis, NAA: NEGATIVE
Chlamydia trachomatis, NAA: NEGATIVE
Megasphaera 1: HIGH Score — AB
NEISSERIA GONORRHOEAE, NAA: NEGATIVE
TRICH VAG BY NAA: NEGATIVE

## 2016-07-15 ENCOUNTER — Ambulatory Visit (HOSPITAL_COMMUNITY)
Admission: EM | Admit: 2016-07-15 | Discharge: 2016-07-15 | Disposition: A | Discharge status: Home / Self Care | Payer: Medicaid Other | Attending: Family Medicine | Admitting: Family Medicine

## 2016-07-15 ENCOUNTER — Encounter (HOSPITAL_COMMUNITY): Payer: Self-pay | Admitting: *Deleted

## 2016-07-15 DIAGNOSIS — Z79899 Other long term (current) drug therapy: Secondary | ICD-10-CM | POA: Insufficient documentation

## 2016-07-15 DIAGNOSIS — B009 Herpesviral infection, unspecified: Secondary | ICD-10-CM | POA: Diagnosis not present

## 2016-07-15 DIAGNOSIS — A6004 Herpesviral vulvovaginitis: Secondary | ICD-10-CM

## 2016-07-15 DIAGNOSIS — Z87891 Personal history of nicotine dependence: Secondary | ICD-10-CM | POA: Diagnosis not present

## 2016-07-15 DIAGNOSIS — N390 Urinary tract infection, site not specified: Secondary | ICD-10-CM | POA: Insufficient documentation

## 2016-07-15 DIAGNOSIS — N760 Acute vaginitis: Secondary | ICD-10-CM | POA: Insufficient documentation

## 2016-07-15 DIAGNOSIS — N898 Other specified noninflammatory disorders of vagina: Secondary | ICD-10-CM | POA: Diagnosis present

## 2016-07-15 LAB — POCT URINALYSIS DIP (DEVICE)
BILIRUBIN URINE: NEGATIVE
GLUCOSE, UA: NEGATIVE mg/dL
Hgb urine dipstick: NEGATIVE
KETONES UR: NEGATIVE mg/dL
Nitrite: NEGATIVE
Protein, ur: NEGATIVE mg/dL
SPECIFIC GRAVITY, URINE: 1.02 (ref 1.005–1.030)
Urobilinogen, UA: 0.2 mg/dL (ref 0.0–1.0)
pH: 6 (ref 5.0–8.0)

## 2016-07-15 LAB — POCT PREGNANCY, URINE: Preg Test, Ur: NEGATIVE

## 2016-07-15 MED ORDER — CEPHALEXIN 500 MG PO CAPS: 500.0000 mg | ORAL_CAPSULE | Freq: Two times a day (BID) | ORAL | 0 refills | Status: AC

## 2016-07-15 MED ORDER — ACYCLOVIR 400 MG PO TABS: 400.0000 mg | ORAL_TABLET | Freq: Three times a day (TID) | ORAL | 0 refills | Status: DC

## 2016-07-15 MED ORDER — FLUCONAZOLE 150 MG PO TABS: 150.0000 mg | ORAL_TABLET | Freq: Once | ORAL | 0 refills | Status: AC

## 2016-07-15 NOTE — Discharge Instructions (Signed)
Start keflex twice daily for 5 days to cover for UTI. I have sent your urine for culture and for STI screening. You will be contacted with the results. After Keflex please take Diflucan to prevent yeast. You do not have a genital herpes outbreak at the moment. I have sent in acyclovir to take if outbreak starts.

## 2016-07-15 NOTE — ED Triage Notes (Signed)
Pt  Reports    Symptoms  Of   Vaginal  Irritation   Some  Discharge   As   Well  As   Pain on urination  For  Several days      She  Is  Unsure  Of  Her last  menstural period

## 2016-07-15 NOTE — ED Provider Notes (Signed)
[facility]    CSN: [id number] Arrival date & time: 07/15/16  1748     History   Chief Complaint Chief Complaint  Patient presents with  . Vaginal Discharge    HPI Sonya Foster is a 26 y.o. female.   HPI 26 year old female presents with three-day history of lower pelvic pressure and urinary urgency. She also endorses 3 days of vaginal discharge with itching. 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 She denies dysuria, hematuria, flank pain, nausea, vomiting. She says active with one partner and uses condoms for contraception. She has 4 children and reports that she does not desire additional children is considering IUD for contraception. She is currently breast-feeding.    Past Medical History:  Diagnosis Date  . Abnormal Pap smear   . Genital HSV   . History of chlamydia   . Late prenatal care     There are no active problems to display for this patient.   History reviewed. No pertinent surgical history.  OB History    Gravida Para Term Preterm AB Living   4 4 4     4    SAB TAB Ectopic Multiple Live Births         0 4       Home Medications    Prior to Admission medications   Medication Sig Start Date End Date Taking? Authorizing Provider  ibuprofen (ADVIL,MOTRIN) 600 MG tablet Take 1 tablet (600 mg total) by mouth every 6 (six) hours. 11/07/15   Orvilla Cornwallenney, Rachelle A, CNM  Prenatal Vit-Fe Phos-FA-Omega (VITAFOL GUMMIES) 3.33-0.333-34.8 MG CHEW Chew 3 tablets by mouth daily. 07/05/15   Roe Coombsenney, Rachelle A, CNM    Family History Family History  Problem Relation Age of Onset  . Diabetes Maternal Grandmother   . Hypertension Maternal Grandmother   . Diabetes Maternal Grandfather   . Hypertension Maternal Grandfather     Social History Social History  Substance Use Topics  . Smoking status: Former Smoker    Quit date: 2017  . Smokeless tobacco: Never Used  . Alcohol use No     Comment: early in the pregnancy      Allergies   Patient has no known allergies.   Review of Systems Review of Systems  Constitutional: Negative for chills and fever.  Eyes: Negative for visual disturbance.  Respiratory: Negative for shortness of breath.   Cardiovascular: Negative for chest pain.  Gastrointestinal: Negative for abdominal pain, blood in stool, nausea and vomiting.  Genitourinary: Positive for pelvic pain, urgency and vaginal bleeding. Negative for dysuria, flank pain and genital sores.  Musculoskeletal: Negative for arthralgias and back pain.  Skin: Negative for rash.  Allergic/Immunologic: Negative for immunocompromised state.  Hematological: Negative for adenopathy. Does not bruise/bleed easily.  Psychiatric/Behavioral: Negative for dysphoric mood and suicidal ideas.     Physical Exam Triage Vital Signs ED Triage Vitals [07/15/16 1816]  Enc Vitals Group     BP 124/78     Pulse Rate 78     Resp 18     Temp 98.6 F (37 C)     Temp Source Oral     SpO2 100 %     Weight      Height      Head Circumference      Peak Flow      Pain Score      Pain Loc      Pain Edu?      Excl. in GC?  No data found.   Updated Vital Signs BP 124/78 (BP Location: Right Arm)   Pulse 78   Temp 98.6 F (37 C) (Oral)   Resp 18   SpO2 100%   Visual Acuity Right Eye Distance:   Left Eye Distance:   Bilateral Distance:    Right Eye Near:   Left Eye Near:    Bilateral Near:     Physical Exam  Constitutional: She appears well-developed and well-nourished. No distress.  Pulmonary/Chest: Breath sounds normal.  Genitourinary: Pelvic exam was performed with patient prone. There is no rash, tenderness or lesion on the right labia. There is no rash, tenderness or lesion on the left labia.  Musculoskeletal: She exhibits no edema.  Skin: Skin is warm and dry. No rash noted.     UC Treatments / Results  Labs (all labs ordered are listed, but only abnormal results are displayed) Labs Reviewed   POCT URINALYSIS DIP (DEVICE) - Abnormal; Notable for the following:       Result Value   Leukocytes, UA TRACE (*)    All other components within normal limits  URINE CULTURE  POCT PREGNANCY, URINE  URINE CYTOLOGY ANCILLARY ONLY    EKG  EKG Interpretation None       Radiology No results found.  Procedures Procedures (including critical care time)  Medications Ordered in UC Medications - No data to display   Initial Impression / Assessment and Plan / UC Course  I have reviewed the triage vital signs and the nursing notes.  Pertinent labs & imaging results that were available during my care of the patient were reviewed by me and considered in my medical decision making (see chart for details).     Patient is negative urine pregnant test. Her urinalysis shows trace leukocyte esterase. She has no genital herpes on external genital exam.   Final Clinical Impressions(s) / UC Diagnoses   Final diagnoses:  Lower urinary tract infectious disease  Herpes simplex vulvovaginitis   Patient with pelvic pressure and urinary urgency consistent with lower urinary tract infection.  Will treat with Keflex followed by Diflucan pending urine culture.  Given reported vaginal discharge urine cytology also obtained.  Acyclovir prescription provided to be used as needed for genital HSV outbreak. There is no active outbreak currently.       New Prescriptions Discharge Medication List as of 07/15/2016  8:00 PM    START taking these medications   Details  acyclovir (ZOVIRAX) 400 MG tablet Take 1 tablet (400 mg total) by mouth 3 (three) times daily. For 5 days as needed for genital herpes outbreak, Starting Wed 07/15/2016, Normal    cephALEXin (KEFLEX) 500 MG capsule Take 1 capsule (500 mg total) by mouth 2 (two) times daily., Starting Wed 07/15/2016, Until Mon 07/20/2016, Normal    fluconazole (DIFLUCAN) 150 MG tablet Take 1 tablet (150 mg total) by mouth once., Starting Wed 07/15/2016,  Normal         Dessa Phi, MD 07/15/16 2103

## 2016-07-16 LAB — URINE CYTOLOGY ANCILLARY ONLY
Chlamydia: NEGATIVE
NEISSERIA GONORRHEA: NEGATIVE
Trichomonas: NEGATIVE

## 2016-07-17 LAB — URINE CYTOLOGY ANCILLARY ONLY
Bacterial vaginitis: POSITIVE — AB
CANDIDA VAGINITIS: NEGATIVE

## 2016-07-18 LAB — URINE CULTURE

## 2017-10-04 ENCOUNTER — Ambulatory Visit (HOSPITAL_COMMUNITY)
Admission: EM | Admit: 2017-10-04 | Discharge: 2017-10-04 | Disposition: A | Discharge status: Home / Self Care | Payer: Self-pay | Attending: Family Medicine | Admitting: Family Medicine

## 2017-10-04 ENCOUNTER — Encounter (HOSPITAL_COMMUNITY): Payer: Self-pay | Admitting: Emergency Medicine

## 2017-10-04 DIAGNOSIS — L03116 Cellulitis of left lower limb: Secondary | ICD-10-CM

## 2017-10-04 MED ORDER — TRAMADOL HCL 50 MG PO TABS: 50.0000 mg | ORAL_TABLET | Freq: Four times a day (QID) | ORAL | 0 refills | Status: DC | PRN

## 2017-10-04 MED ORDER — SULFAMETHOXAZOLE-TRIMETHOPRIM 800-160 MG PO TABS: 1.0000 | ORAL_TABLET | Freq: Two times a day (BID) | ORAL | 0 refills | Status: AC

## 2017-10-04 MED ORDER — TRIAMCINOLONE ACETONIDE 0.1 % EX CREA: 1.0000 "application " | TOPICAL_CREAM | Freq: Two times a day (BID) | CUTANEOUS | 0 refills | Status: DC

## 2017-10-04 NOTE — ED Triage Notes (Signed)
Pt states Friday she noticed a pimple on her L upper late and she popped it, thinks it might be a spider bite. Now pt states its gotten much worse, large area of swelling around her L upper leg, grapefruit sided red and raised area to leg. Pt c/o severe pain.

## 2017-10-04 NOTE — Discharge Instructions (Signed)
Please begin taking Bactrim twice daily for the next week May apply Kenalog cream to area to help with itching and local allergic reaction Please also take antihistamines, may take Zyrtec during the day, Benadryl at bedtime Take Tylenol and ibuprofen for mild to moderate pain, may use tramadol for for severe pain, this may cause sleepiness, please do not use when you need to drive or work  Please continue to monitor this area, please return if redness continuing to spread, worsening, developing fever, worsening pain

## 2017-10-05 NOTE — ED Provider Notes (Signed)
[facility]    CSN: [id number] Arrival date & time: 10/04/17  1049     History   Chief Complaint Chief Complaint  Patient presents with  . Possible Insect Bite    HPI Verlee MonteGloria Marton is a 27 y.o. female no significant past medical history presenting today for evaluation of possible spider bite to left leg.  Patient states that for the past 4 days she has had a bump on her left thigh, since that she has had increased redness, swelling and pain to the area.  She is unsure what bit her.  She does not remember removing any ticks.  Main complaint is the pain.  Denies any drainage.  Denies fevers.  HPI  Past Medical History:  Diagnosis Date  . Abnormal Pap smear   . Genital HSV   . History of chlamydia   . Late prenatal care     There are no active problems to display for this patient.   History reviewed. No pertinent surgical history.  OB History    Gravida  4   Para  4   Term  4   Preterm      AB      Living  4     SAB      TAB      Ectopic      Multiple  0   Live Births  4            Home Medications    Prior to Admission medications   Medication Sig Start Date End Date Taking? Authorizing Provider  acyclovir (ZOVIRAX) 400 MG tablet Take 1 tablet (400 mg total) by mouth 3 (three) times daily. For 5 days as needed for genital herpes outbreak 07/15/16   Dessa PhiFunches, Josalyn, MD  ibuprofen (ADVIL,MOTRIN) 600 MG tablet Take 1 tablet (600 mg total) by mouth every 6 (six) hours. 11/07/15   Orvilla Cornwallenney, Rachelle A, CNM  Prenatal Vit-Fe Phos-FA-Omega (VITAFOL GUMMIES) 3.33-0.333-34.8 MG CHEW Chew 3 tablets by mouth daily. 07/05/15   Orvilla Cornwallenney, Rachelle A, CNM  sulfamethoxazole-trimethoprim (BACTRIM DS,SEPTRA DS) 800-160 MG tablet Take 1 tablet by mouth 2 (two) times daily for 7 days. 10/04/17 10/11/17  Wilda Wetherell C, PA-C  traMADol (ULTRAM) 50 MG tablet Take 1 tablet (50 mg total) by mouth every 6 (six) hours as needed. 10/04/17   Lewin Pellow C, PA-C    triamcinolone cream (KENALOG) 0.1 % Apply 1 application topically 2 (two) times daily. 10/04/17   Adalina Dopson, Junius CreamerHallie C, PA-C    Family History Family History  Problem Relation Age of Onset  . Diabetes Maternal Grandmother   . Hypertension Maternal Grandmother   . Diabetes Maternal Grandfather   . Hypertension Maternal Grandfather     Social History Social History   Tobacco Use  . Smoking status: Former Smoker    Last attempt to quit: 2017    Years since quitting: 2.7  . Smokeless tobacco: Never Used  Substance Use Topics  . Alcohol use: No    Comment: early in the pregnancy  . Drug use: No     Allergies   Patient has no known allergies.   Review of Systems Review of Systems  Constitutional: Negative for fatigue and fever.  HENT: Negative for mouth sores.   Eyes: Negative for visual disturbance.  Respiratory: Negative for shortness of breath.   Cardiovascular: Negative for chest pain.  Gastrointestinal: Negative for abdominal pain, nausea and vomiting.  Genitourinary: Negative for genital sores.  Musculoskeletal: Negative for arthralgias  and joint swelling.  Skin: Positive for color change and wound. Negative for rash.  Neurological: Negative for dizziness, weakness, light-headedness and headaches.     Physical Exam Triage Vital Signs ED Triage Vitals [10/04/17 1152]  Enc Vitals Group     BP (!) 121/100     Pulse Rate 76     Resp 16     Temp (!) 97.3 F (36.3 C)     Temp src      SpO2 100 %     Weight      Height      Head Circumference      Peak Flow      Pain Score      Pain Loc      Pain Edu?      Excl. in GC?    No data found.  Updated Vital Signs BP (!) 121/100   Pulse 76   Temp (!) 97.3 F (36.3 C)   Resp 16   SpO2 100%   Breastfeeding? No   Visual Acuity Right Eye Distance:   Left Eye Distance:   Bilateral Distance:    Right Eye Near:   Left Eye Near:    Bilateral Near:     Physical Exam  Constitutional: She is oriented to  person, place, and time. She appears well-developed and well-nourished.  No acute distress  HENT:  Head: Normocephalic and atraumatic.  Nose: Nose normal.  Eyes: Conjunctivae are normal.  Neck: Neck supple.  Cardiovascular: Normal rate.  Pulmonary/Chest: Effort normal. No respiratory distress.  Abdominal: She exhibits no distension.  Musculoskeletal: Normal range of motion.  Neurological: She is alert and oriented to person, place, and time.  Skin: Skin is warm and dry.  5 cm x 6 cm area of erythema to left posterior thigh, central area with purple discoloration, increased warmth  Psychiatric: She has a normal mood and affect.  Nursing note and vitals reviewed.    UC Treatments / Results  Labs (all labs ordered are listed, but only abnormal results are displayed) Labs Reviewed - No data to display  EKG None  Radiology No results found.  Procedures Procedures (including critical care time)  Medications Ordered in UC Medications - No data to display  Initial Impression / Assessment and Plan / UC Course  I have reviewed the triage vital signs and the nursing notes.  Pertinent labs & imaging results that were available during my care of the patient were reviewed by me and considered in my medical decision making (see chart for details).     Patient appears to have possible bite with secondary cellulitis versus local allergic reaction.  Given amount of erythema we will go ahead and cover for cellulitis, will provide Bactrim.  Kenalog cream to help with itching and swelling locally.  Antihistamines.  Ice.  Tylenol and ibuprofen for pain.  Did provide patient with 6 tablets of tramadol for severe pain.Discussed strict return precautions. Patient verbalized understanding and is agreeable with plan.  Final Clinical Impressions(s) / UC Diagnoses   Final diagnoses:  Cellulitis of leg, left     Discharge Instructions     Please begin taking Bactrim twice daily for the next  week May apply Kenalog cream to area to help with itching and local allergic reaction Please also take antihistamines, may take Zyrtec during the day, Benadryl at bedtime Take Tylenol and ibuprofen for mild to moderate pain, may use tramadol for for severe pain, this may cause sleepiness, please do not use  when you need to drive or work  Please continue to monitor this area, please return if redness continuing to spread, worsening, developing fever, worsening pain   ED Prescriptions    Medication Sig Dispense Auth. Provider   sulfamethoxazole-trimethoprim (BACTRIM DS,SEPTRA DS) 800-160 MG tablet Take 1 tablet by mouth 2 (two) times daily for 7 days. 14 tablet Rinaldo Macqueen C, PA-C   triamcinolone cream (KENALOG) 0.1 % Apply 1 application topically 2 (two) times daily. 30 g Sharelle Burditt C, PA-C   traMADol (ULTRAM) 50 MG tablet Take 1 tablet (50 mg total) by mouth every 6 (six) hours as needed. 6 tablet Gerri Acre, [place] C, PA-C     Controlled Substance Prescriptions [place] Controlled Substance Registry consulted? Yes, I have consulted the [place] Controlled Substances Registry for this patient, and feel the risk/benefit ratio today is favorable for proceeding with this prescription for a controlled substance.   Lew Dawes, [place] 10/05/17 [phone]

## 2018-02-28 IMAGING — US US MFM OB LIMITED
1 series · 15 of 28 positions shown · non-contrast
Comparison: none

[Series 1: us mfm ob limited · 34 acquisitions, 15 frames shown]
[im 1/34]
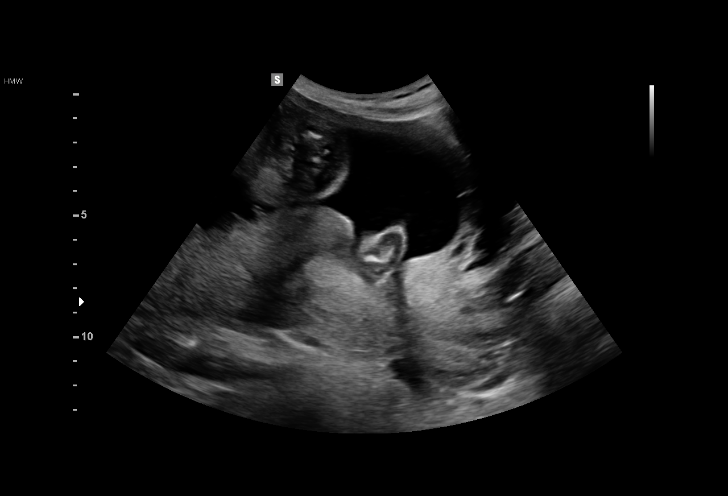
[im 3/34]
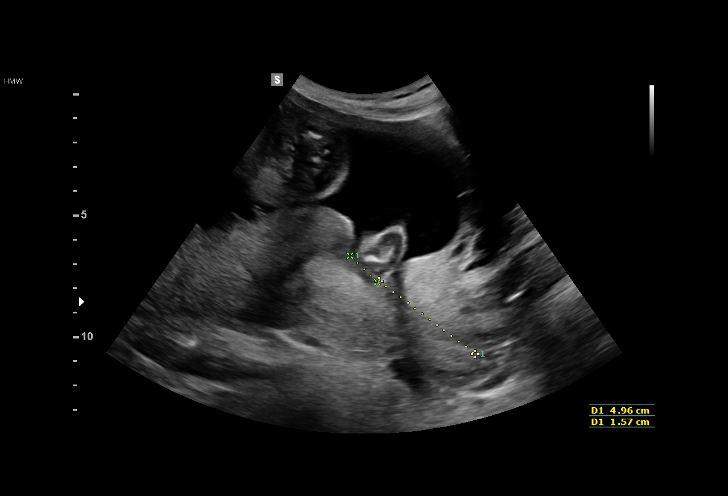
[im 5/34]
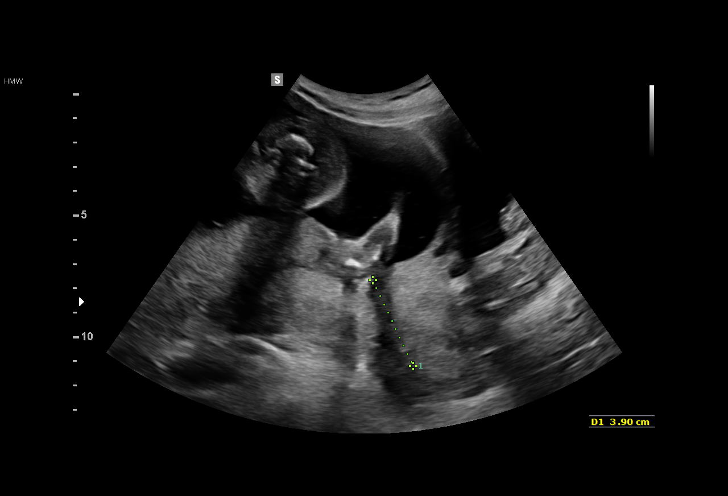
[im 8/34]
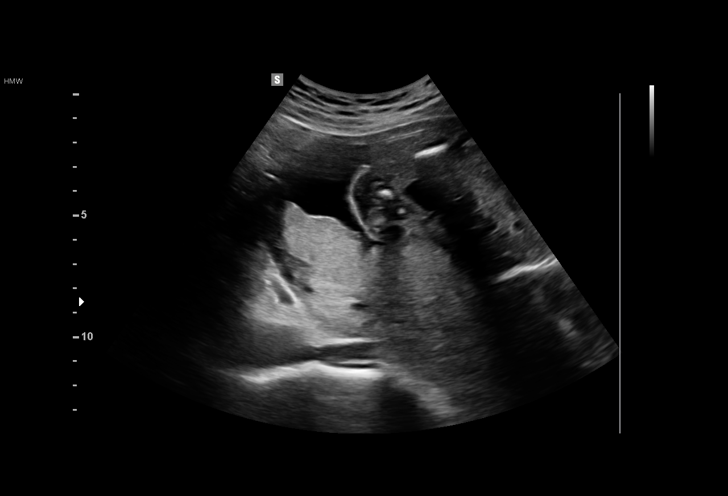
[im 10/34]
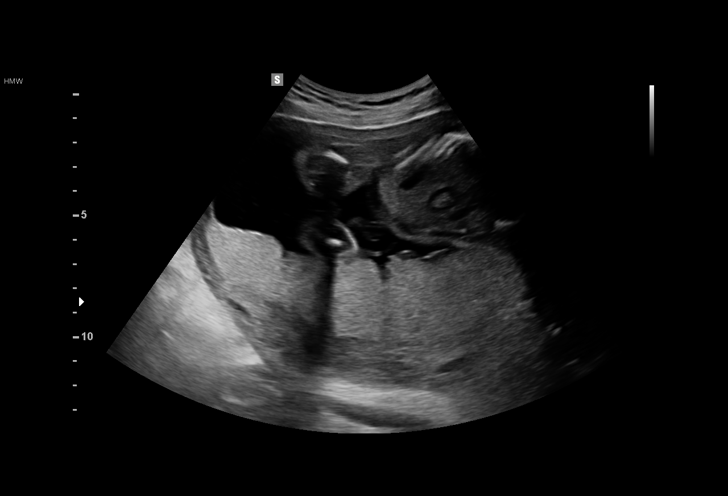
[im 13/34]
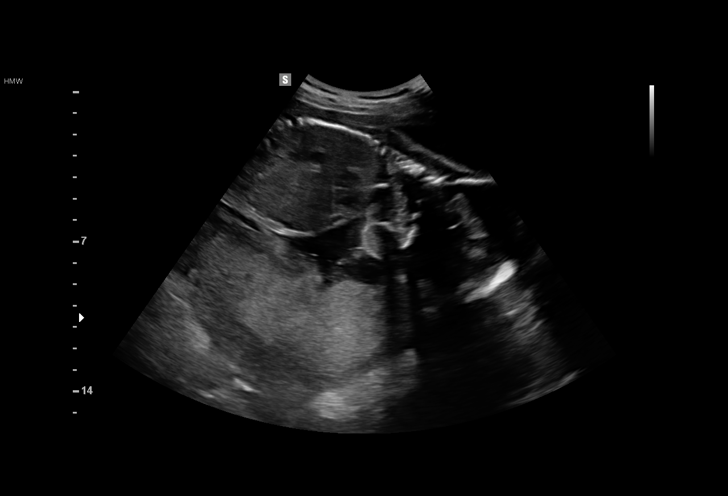
[im 15/34]
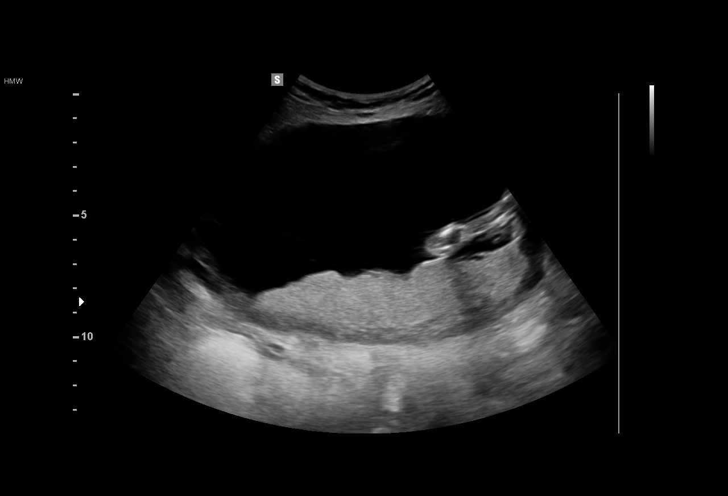
[im 18/34]
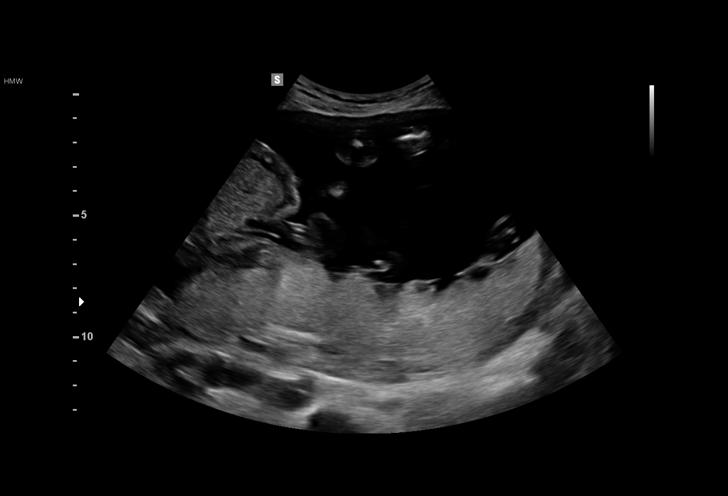
[im 19/34]
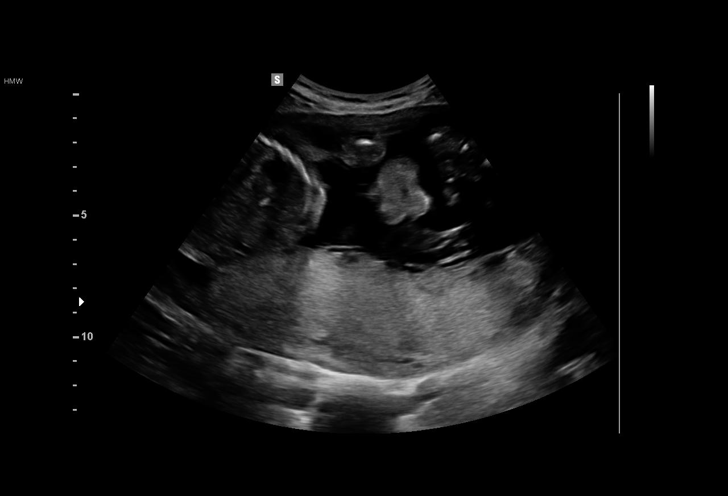
[im 21/34]
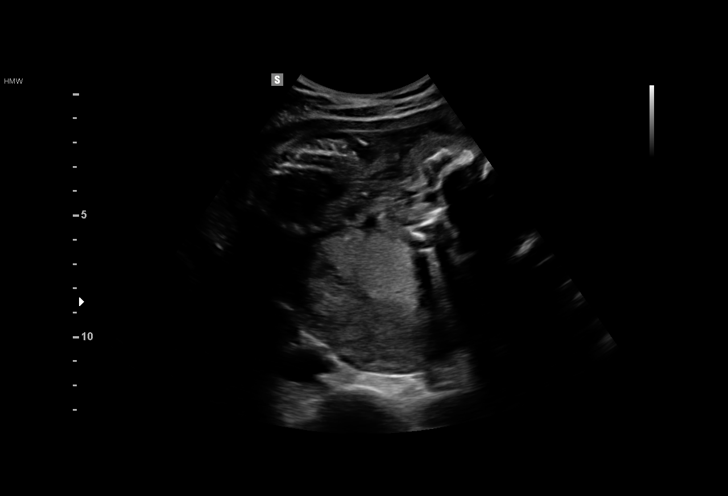
[im 24/34]
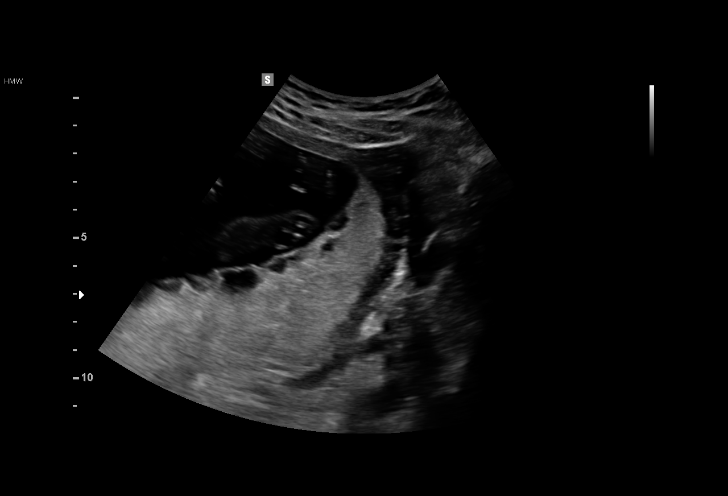
[im 26/34]
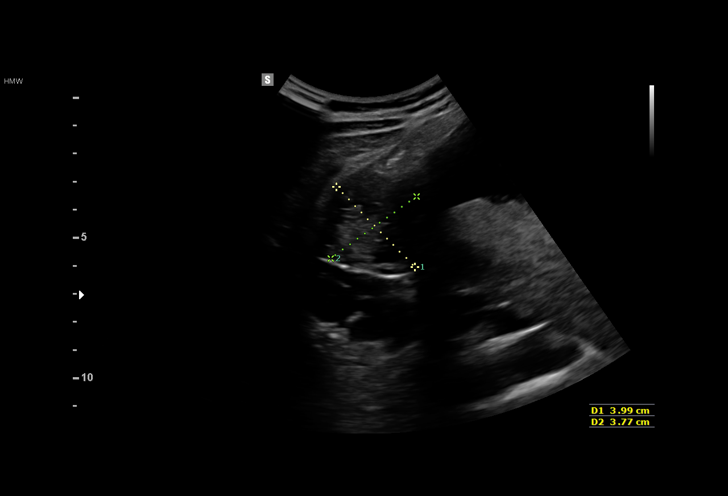
[im 29/34]
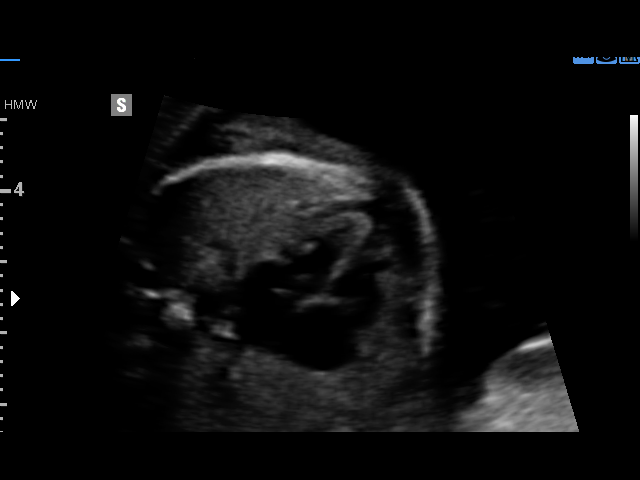
[im 31/34]
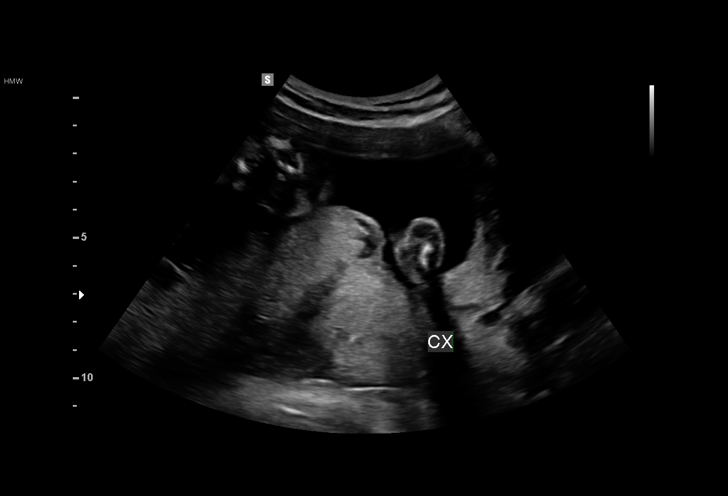
[im 34/34]
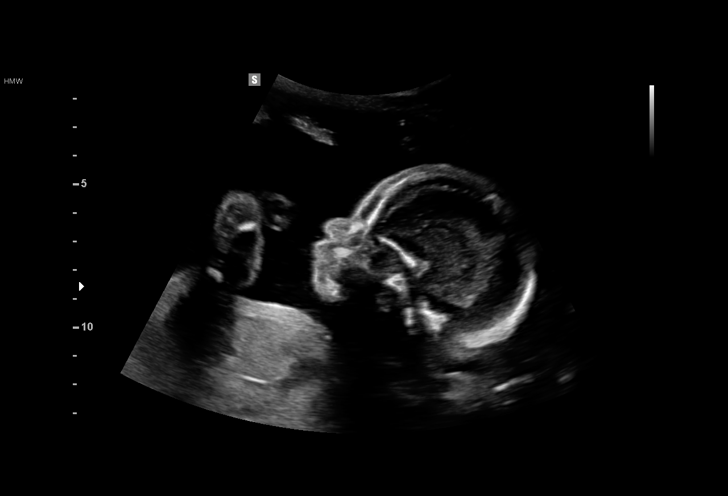

[15 of 28 positions shown; findings below may reference images not displayed]

Performed By:     Krys Gorski          Secondary Phy.:   CHAIM SWARTZ
DJNIHEE/Triage

Indications

23 weeks gestation of pregnancy
Vaginal bleeding in pregnancy, second
trimester
OB History

Gravidity:    4         Term:   3
Living:       3
Fetal Evaluation

Num Of Fetuses:     1
Fetal Heart         155
Rate(bpm):
Cardiac Activity:   Observed
Presentation:       Cephalic
Placenta:           Posterior, above cervical os
P. Cord Insertion:  Visualized, central

Amniotic Fluid
AFI FV:      Subjectively within normal limits

Largest Pocket(cm)
6.3

Comment:    No placental abruption identified.
Gestational Age

LMP:           21w 5d       Date:   02/08/15                 EDD:   11/15/15
Best:          23w 0d    Det. By:   Early Ultrasound         EDD:   11/06/15
(04/21/15)
Cervix Uterus Adnexa

Cervix
Length:              5  cm.
Normal appearance by transabdominal scan.

Uterus
No abnormality visualized.

Left Ovary
Not visualized.

Right Ovary
Size(cm)        4  x    3.8    x  3.2       Vol(ml):
Within normal limits.

Cul De Sac:   No free fluid seen.

Adnexa:       No abnormality visualized.
Impression

SIUP at 23+0 weeks
Normal amniotic fluid volume
Posterior placenta; no previa; no subchorionic fluid
collections/hemorrhage identified
Recommendations

Follow-up as clinically indicated

## 2018-05-03 ENCOUNTER — Encounter (HOSPITAL_COMMUNITY): Payer: Self-pay

## 2018-05-03 ENCOUNTER — Ambulatory Visit (HOSPITAL_COMMUNITY)
Admission: EM | Admit: 2018-05-03 | Discharge: 2018-05-03 | Disposition: A | Discharge status: Home / Self Care | Payer: Self-pay | Attending: Family Medicine | Admitting: Family Medicine

## 2018-05-03 DIAGNOSIS — L720 Epidermal cyst: Secondary | ICD-10-CM

## 2018-05-03 MED ORDER — IBUPROFEN 600 MG PO TABS: 600.0000 mg | ORAL_TABLET | Freq: Three times a day (TID) | ORAL | 0 refills | Status: DC

## 2018-05-03 NOTE — ED Provider Notes (Signed)
[facility]    CSN: [id number] Arrival date & time: 05/03/18  1314     History   Chief Complaint Chief Complaint  Patient presents with  . Foreign Body in Skin    HPI Sonya Foster is a 28 y.o. female.   HPI  Patient noticed a bump on her left anterior chest above her left breast yesterday.  She panicked and is here today for evaluation.  She has a history of a blood clot before.  She states every time she gets up but now she wants to be seen.  The lump is mildly tender, mostly when she rubs over it.  No trauma.  No change in the coloration of the skin.  She did not notice it be for yesterday  Past Medical History:  Diagnosis Date  . Abnormal Pap smear   . Genital HSV   . History of chlamydia   . Late prenatal care     There are no active problems to display for this patient.   History reviewed. No pertinent surgical history.  OB History    Gravida  4   Para  4   Term  4   Preterm      AB      Living  4     SAB      TAB      Ectopic      Multiple  0   Live Births  4            Home Medications    Prior to Admission medications   Medication Sig Start Date End Date Taking? Authorizing Provider  acyclovir (ZOVIRAX) 400 MG tablet Take 1 tablet (400 mg total) by mouth 3 (three) times daily. For 5 days as needed for genital herpes outbreak 07/15/16   Dessa Phi, MD  ibuprofen (ADVIL,MOTRIN) 600 MG tablet Take 1 tablet (600 mg total) by mouth 3 (three) times daily. 05/03/18   Eustace Moore, MD    Family History Family History  Problem Relation Age of Onset  . Diabetes Maternal Grandmother   . Hypertension Maternal Grandmother   . Diabetes Maternal Grandfather   . Hypertension Maternal Grandfather     Social History Social History   Tobacco Use  . Smoking status: Former Smoker    Last attempt to quit: 2017    Years since quitting: 3.2  . Smokeless tobacco: Never Used  Substance Use Topics  . Alcohol use: No   Comment: early in the pregnancy  . Drug use: No     Allergies   Patient has no known allergies.   Review of Systems Review of Systems  Constitutional: Negative for chills and fever.  HENT: Negative for ear pain and sore throat.   Eyes: Negative for pain and visual disturbance.  Respiratory: Negative for cough and shortness of breath.   Cardiovascular: Negative for chest pain and palpitations.  Gastrointestinal: Negative for abdominal pain and vomiting.  Genitourinary: Negative for dysuria and hematuria.  Musculoskeletal: Negative for arthralgias and back pain.  Skin: Negative for color change and rash.       Lump  Neurological: Negative for seizures and syncope.  All other systems reviewed and are negative.    Physical Exam Triage Vital Signs ED Triage Vitals  Enc Vitals Group     BP 05/03/18 1433 (!) 123/95     Pulse Rate 05/03/18 1433 74     Resp 05/03/18 1433 18     Temp 05/03/18 1433 98.1  F (36.7 C)     Temp src --      SpO2 05/03/18 1433 100 %     Weight --      Height --      Head Circumference --      Peak Flow --      Pain Score 05/03/18 1512 1     Pain Loc --      Pain Edu? --      Excl. in GC? --    No data found.  Updated Vital Signs BP (!) 123/95   Pulse 74   Temp 98.1 F (36.7 C)   Resp 18   LMP 04/20/2018 (Approximate)   SpO2 100%       Physical Exam Constitutional:      General: She is not in acute distress.    Appearance: She is well-developed.  HENT:     Head: Normocephalic and atraumatic.  Eyes:     Conjunctiva/sclera: Conjunctivae normal.     Pupils: Pupils are equal, round, and reactive to light.  Neck:     Musculoskeletal: Normal range of motion.  Cardiovascular:     Rate and Rhythm: Normal rate.  Pulmonary:     Effort: Pulmonary effort is normal. No respiratory distress.  Chest:    Abdominal:     General: There is no distension.     Palpations: Abdomen is soft.  Musculoskeletal: Normal range of motion.  Skin:     General: Skin is warm and dry.  Neurological:     Mental Status: She is alert.      UC Treatments / Results  Labs (all labs ordered are listed, but only abnormal results are displayed) Labs Reviewed - No data to display  EKG None  Radiology No results found.  Procedures Procedures (including critical care time)  Medications Ordered in UC Medications - No data to display  Initial Impression / Assessment and Plan / UC Course  I have reviewed the triage vital signs and the nursing notes.  Pertinent labs & imaging results that were available during my care of the patient were reviewed by me and considered in my medical decision making (see chart for details).     Discussed DVTs.  Superficial thrombosis.  This is neither. Final Clinical Impressions(s) / UC Diagnoses   Final diagnoses:  Epidermal cyst     Discharge Instructions     May take ibuprofen for discomfort May use a warm compress to area This is not dangerous and does not require further evaluation   ED Prescriptions    Medication Sig Dispense Auth. Provider   ibuprofen (ADVIL,MOTRIN) 600 MG tablet Take 1 tablet (600 mg total) by mouth 3 (three) times daily. 30 tablet Eustace MooreNelson, Enaya Howze Sue, MD     Controlled Substance Prescriptions [place] Controlled Substance Registry consulted? Not Applicable   Eustace MooreNelson, Trenden Hazelrigg Sue, MD 05/03/18 [phone]

## 2018-05-03 NOTE — ED Triage Notes (Signed)
Small knot under skin on left chest that is tender to touch , first noticed yesterday

## 2018-05-03 NOTE — Discharge Instructions (Signed)
May take ibuprofen for discomfort May use a warm compress to area This is not dangerous and does not require further evaluation

## 2018-06-17 ENCOUNTER — Encounter: Payer: Self-pay | Admitting: *Deleted

## 2018-07-21 ENCOUNTER — Encounter (HOSPITAL_COMMUNITY): Payer: Self-pay | Admitting: Emergency Medicine

## 2018-07-21 ENCOUNTER — Emergency Department (HOSPITAL_COMMUNITY)
Admission: EM | Admit: 2018-07-21 | Discharge: 2018-07-21 | Disposition: A | Discharge status: Home / Self Care | Payer: Self-pay | Attending: Emergency Medicine | Admitting: Emergency Medicine

## 2018-07-21 ENCOUNTER — Other Ambulatory Visit: Payer: Self-pay

## 2018-07-21 DIAGNOSIS — Z87891 Personal history of nicotine dependence: Secondary | ICD-10-CM | POA: Insufficient documentation

## 2018-07-21 DIAGNOSIS — K047 Periapical abscess without sinus: Secondary | ICD-10-CM | POA: Insufficient documentation

## 2018-07-21 DIAGNOSIS — Z79899 Other long term (current) drug therapy: Secondary | ICD-10-CM | POA: Insufficient documentation

## 2018-07-21 MED ORDER — PENICILLIN V POTASSIUM 500 MG PO TABS: 500.0000 mg | ORAL_TABLET | Freq: Four times a day (QID) | ORAL | 0 refills | Status: AC

## 2018-07-21 MED ORDER — ACETAMINOPHEN 325 MG PO TABS
650.0000 mg | ORAL_TABLET | Freq: Once | ORAL | Status: DC
  Filled 2018-07-21: qty 2

## 2018-07-21 MED ORDER — IBUPROFEN 800 MG PO TABS
800.0000 mg | ORAL_TABLET | Freq: Once | ORAL | Status: DC
  Filled 2018-07-21: qty 1

## 2018-07-21 NOTE — ED Notes (Signed)
Bed: WTR6 Expected date:  Expected time:  Means of arrival:  Comments: 

## 2018-07-21 NOTE — ED Provider Notes (Signed)
[facility] DEPT Provider Note   CSN: [id number] Arrival date & time: 07/21/18  1026    History   Chief Complaint Chief Complaint  Patient presents with  . Dental Pain    HPI Sonya Foster is a 28 y.o. female.     28 year old female presents with complaint of dental pain.  Patient reports upper and lower, left and right pain for the past 4 days.  Patient thinks that she has about 5 teeth that need to be extracted.  Patient denies trauma, fever, drainage.  No other complaints or concerns.     Past Medical History:  Diagnosis Date  . Abnormal Pap smear   . Genital HSV   . History of chlamydia   . Late prenatal care     There are no active problems to display for this patient.   History reviewed. No pertinent surgical history.   OB History    Gravida  4   Para  4   Term  4   Preterm      AB      Living  4     SAB      TAB      Ectopic      Multiple  0   Live Births  4            Home Medications    Prior to Admission medications   Medication Sig Start Date End Date Taking? Authorizing Provider  acyclovir (ZOVIRAX) 400 MG tablet Take 1 tablet (400 mg total) by mouth 3 (three) times daily. For 5 days as needed for genital herpes outbreak 07/15/16   Boykin Nearing, MD  penicillin v potassium (VEETID) 500 MG tablet Take 1 tablet (500 mg total) by mouth 4 (four) times daily for 10 days. 07/21/18 07/31/18  Tacy Learn, PA-C    Family History Family History  Problem Relation Age of Onset  . Diabetes Maternal Grandmother   . Hypertension Maternal Grandmother   . Diabetes Maternal Grandfather   . Hypertension Maternal Grandfather     Social History Social History   Tobacco Use  . Smoking status: Former Smoker    Quit date: 2017    Years since quitting: 3.5  . Smokeless tobacco: Never Used  Substance Use Topics  . Alcohol use: No    Comment: early in the pregnancy  . Drug use: No     Allergies    Patient has no known allergies.   Review of Systems Review of Systems  Constitutional: Negative for chills and fever.  HENT: Positive for dental problem. Negative for trouble swallowing and voice change.   Gastrointestinal: Negative for vomiting.  Musculoskeletal: Negative for neck pain and neck stiffness.  Skin: Negative for rash and wound.  Allergic/Immunologic: Negative for immunocompromised state.  Neurological: Negative for headaches.  Hematological: Negative for adenopathy.  Psychiatric/Behavioral: Negative for confusion.  All other systems reviewed and are negative.    Physical Exam Updated Vital Signs BP (!) 133/99 (BP Location: Right Arm)   Pulse 86   Temp 98.5 F (36.9 C) (Oral)   Resp 17   LMP 07/14/2018   SpO2 100%   Physical Exam Vitals signs and nursing note reviewed.  Constitutional:      General: She is not in acute distress.    Appearance: She is well-developed. She is not diaphoretic.  HENT:     Head: Normocephalic and atraumatic.     Jaw: No trismus.     Mouth/Throat:  Comments: No significant dental infection noted, tenderness palpation various upper and lower teeth without obvious abscess or drainage. Pulmonary:     Effort: Pulmonary effort is normal.  Skin:    General: Skin is warm and dry.     Findings: No erythema.  Neurological:     Mental Status: She is alert and oriented to person, place, and time.  Psychiatric:        Behavior: Behavior normal.      ED Treatments / Results  Labs (all labs ordered are listed, but only abnormal results are displayed) Labs Reviewed - No data to display  EKG None  Radiology No results found.  Procedures Procedures (including critical care time)  Medications Ordered in ED Medications  ibuprofen (ADVIL) tablet 800 mg (has no administration in time range)  acetaminophen (TYLENOL) tablet 650 mg (has no administration in time range)     Initial Impression / Assessment and Plan / ED Course   I have reviewed the triage vital signs and the nursing notes.  Pertinent labs & imaging results that were available during my care of the patient were reviewed by me and considered in my medical decision making (see chart for details).       Final Clinical Impressions(s) / ED Diagnoses   Final diagnoses:  Dental abscess    ED Discharge Orders         Ordered    penicillin v potassium (VEETID) 500 MG tablet  4 times daily     07/21/18 1143           Jeannie FendMurphy, Laura A, PA-C 07/21/18 1148    Tegeler, Canary Brimhristopher J, MD 07/21/18 1537

## 2018-07-21 NOTE — Discharge Instructions (Addendum)
Take 400mg  Advil Liquid Gel with 650mg  Tylenol every 6 hours for pain. Take penicillin as prescribed and complete the full course. Rinse with Listerine after every meal. Follow-up with a dentist as soon as possible.

## 2018-07-21 NOTE — ED Triage Notes (Signed)
Pt reports that she dont have insurance and been having dental pains that been dealing with for while now.

## 2018-10-31 ENCOUNTER — Emergency Department (HOSPITAL_COMMUNITY)
Admission: EM | Admit: 2018-10-31 | Discharge: 2018-11-01 | Disposition: A | Discharge status: Home / Self Care | Payer: Self-pay | Attending: Emergency Medicine | Admitting: Emergency Medicine

## 2018-10-31 ENCOUNTER — Emergency Department (HOSPITAL_COMMUNITY): Payer: Self-pay

## 2018-10-31 ENCOUNTER — Other Ambulatory Visit: Payer: Self-pay

## 2018-10-31 DIAGNOSIS — R1032 Left lower quadrant pain: Secondary | ICD-10-CM | POA: Insufficient documentation

## 2018-10-31 DIAGNOSIS — Z79899 Other long term (current) drug therapy: Secondary | ICD-10-CM | POA: Insufficient documentation

## 2018-10-31 DIAGNOSIS — N390 Urinary tract infection, site not specified: Secondary | ICD-10-CM | POA: Insufficient documentation

## 2018-10-31 DIAGNOSIS — R0602 Shortness of breath: Secondary | ICD-10-CM | POA: Insufficient documentation

## 2018-10-31 DIAGNOSIS — Z87891 Personal history of nicotine dependence: Secondary | ICD-10-CM | POA: Insufficient documentation

## 2018-10-31 DIAGNOSIS — R109 Unspecified abdominal pain: Secondary | ICD-10-CM

## 2018-10-31 NOTE — ED Triage Notes (Signed)
Pt states she went to the hospital in December 2018 for SOB and she had a pulmonary embolism in her left lung and "took a shot in her left side to treat it". Pt is here today because of SOB that has been ongoing for the last few months and lower left back pain.

## 2018-11-01 LAB — URINALYSIS, ROUTINE W REFLEX MICROSCOPIC
Bilirubin Urine: NEGATIVE
Glucose, UA: NEGATIVE mg/dL
Ketones, ur: 20 mg/dL — AB
Nitrite: POSITIVE — AB
Protein, ur: NEGATIVE mg/dL
Specific Gravity, Urine: 1.014 (ref 1.005–1.030)
pH: 5 (ref 5.0–8.0)

## 2018-11-01 LAB — POC URINE PREG, ED: Preg Test, Ur: NEGATIVE

## 2018-11-01 LAB — CBC WITH DIFFERENTIAL/PLATELET
Abs Immature Granulocytes: 0.01 10*3/uL (ref 0.00–0.07)
Basophils Absolute: 0 10*3/uL (ref 0.0–0.1)
Basophils Relative: 1 %
Eosinophils Absolute: 0.4 10*3/uL (ref 0.0–0.5)
Eosinophils Relative: 6 %
HCT: 41.3 % (ref 36.0–46.0)
Hemoglobin: 13.8 g/dL (ref 12.0–15.0)
Immature Granulocytes: 0 %
Lymphocytes Relative: 40 %
Lymphs Abs: 2.3 10*3/uL (ref 0.7–4.0)
MCH: 28.6 pg (ref 26.0–34.0)
MCHC: 33.4 g/dL (ref 30.0–36.0)
MCV: 85.5 fL (ref 80.0–100.0)
Monocytes Absolute: 0.3 10*3/uL (ref 0.1–1.0)
Monocytes Relative: 6 %
Neutro Abs: 2.7 10*3/uL (ref 1.7–7.7)
Neutrophils Relative %: 47 %
Platelets: 300 10*3/uL (ref 150–400)
RBC: 4.83 MIL/uL (ref 3.87–5.11)
RDW: 13.2 % (ref 11.5–15.5)
WBC: 5.6 10*3/uL (ref 4.0–10.5)
nRBC: 0 % (ref 0.0–0.2)

## 2018-11-01 LAB — BASIC METABOLIC PANEL
Anion gap: 12 (ref 5–15)
BUN: 9 mg/dL (ref 6–20)
CO2: 24 mmol/L (ref 22–32)
Calcium: 9.3 mg/dL (ref 8.9–10.3)
Chloride: 102 mmol/L (ref 98–111)
Creatinine, Ser: 0.63 mg/dL (ref 0.44–1.00)
GFR calc Af Amer: >60 mL/min (ref >60)
GFR calc non Af Amer: >60 mL/min (ref >60)
Glucose, Bld: 94 mg/dL (ref 70–99)
Potassium: 3.6 mmol/L (ref 3.5–5.1)
Sodium: 138 mmol/L (ref 135–145)

## 2018-11-01 LAB — D-DIMER, QUANTITATIVE: D-Dimer, Quant: 0.31 ug/mL-FEU (ref 0.00–0.50)

## 2018-11-01 MED ORDER — NITROFURANTOIN MONOHYD MACRO 100 MG PO CAPS: 100.0000 mg | ORAL_CAPSULE | Freq: Two times a day (BID) | ORAL | 0 refills | Status: DC

## 2018-11-01 MED ORDER — NITROFURANTOIN MONOHYD MACRO 100 MG PO CAPS
100.0000 mg | ORAL_CAPSULE | Freq: Once | ORAL | Status: AC
  Administered 2018-11-01: 100 mg via ORAL
  Filled 2018-11-01: qty 1

## 2018-11-01 NOTE — Discharge Instructions (Addendum)
Take acetaminophen or ibuprofen as needed for pain.  Drink plenty of fluids.  Return if you start running a fever, start vomiting, or if pain is getting worse.

## 2018-11-01 NOTE — ED Provider Notes (Signed)
[facility] EMERGENCY DEPARTMENT Provider Note   CSN: [id number] Arrival date & time: 10/31/18  1913    History   Chief Complaint Chief Complaint  Patient presents with  . Back Pain  . Shortness of Breath    HPI Sonya Foster is a 28 y.o. female.   The history is provided by the patient.  She has history of pulmonary embolism and comes in complaining of pain in the left upper back for the last 3 weeks.  Pain is worse with deep breathing.  There is no associated dyspnea or cough.  She denies any fever.  She has also noted some urinary frequency and states that her urine is malodorous and she thinks she may have a urinary infection.  She denies abdominal pain, nausea, vomiting, diarrhea.  She denies any vaginal discharge.  She is not using any contraception but states that her last menses was a week ago and she is certain she is not pregnant.  Of note, prior pulmonary embolism was during pregnancy.  She denies any recent travel, surgery, exogenous estrogen use.  Past Medical History:  Diagnosis Date  . Abnormal Pap smear   . Genital HSV   . History of chlamydia   . Late prenatal care     There are no active problems to display for this patient.   No past surgical history on file.   OB History    Gravida  4   Para  4   Term  4   Preterm      AB      Living  4     SAB      TAB      Ectopic      Multiple  0   Live Births  4            Home Medications    Prior to Admission medications   Medication Sig Start Date End Date Taking? Authorizing Provider  acyclovir (ZOVIRAX) 400 MG tablet Take 1 tablet (400 mg total) by mouth 3 (three) times daily. For 5 days as needed for genital herpes outbreak 07/15/16   Boykin Nearing, MD    Family History Family History  Problem Relation Age of Onset  . Diabetes Maternal Grandmother   . Hypertension Maternal Grandmother   . Diabetes Maternal Grandfather   . Hypertension Maternal  Grandfather     Social History Social History   Tobacco Use  . Smoking status: Former Smoker    Quit date: 2017    Years since quitting: 3.7  . Smokeless tobacco: Never Used  Substance Use Topics  . Alcohol use: No    Comment: early in the pregnancy  . Drug use: No     Allergies   Patient has no known allergies.   Review of Systems Review of Systems  All other systems reviewed and are negative.    Physical Exam Updated Vital Signs BP 106/75   Pulse 60   Temp 98.2 F (36.8 C) (Oral)   Resp 16   LMP 10/24/2018 (Approximate)   SpO2 99%   Physical Exam Vitals signs and nursing note reviewed.    28 year old female, resting comfortably and in no acute distress. Vital signs are normal. Oxygen saturation is 99%, which is normal. Head is normocephalic and atraumatic. PERRLA, EOMI. Oropharynx is clear. Neck is nontender and supple without adenopathy or JVD. Back is nontender in the midline.  There is moderate left CVA tenderness. Lungs are clear without  rales, wheezes, or rhonchi. Chest is nontender. Heart has regular rate and rhythm without murmur. Abdomen is soft, flat, with mild left lower quadrant tenderness.  There is no rebound or guarding.  There are no masses or hepatosplenomegaly and peristalsis is normoactive. Extremities have no cyanosis or edema, full range of motion is present. Skin is warm and dry without rash. Neurologic: Mental status is normal, cranial nerves are intact, there are no motor or sensory deficits.  ED Treatments / Results  Labs (all labs ordered are listed, but only abnormal results are displayed) Labs Reviewed  URINALYSIS, ROUTINE W REFLEX MICROSCOPIC - Abnormal; Notable for the following components:      Result Value   APPearance HAZY (*)    Hgb urine dipstick SMALL (*)    Ketones, ur 20 (*)    Nitrite POSITIVE (*)    Leukocytes,Ua TRACE (*)    Bacteria, UA MANY (*)    All other components within normal limits  BASIC METABOLIC  PANEL  CBC WITH DIFFERENTIAL/PLATELET  D-DIMER, QUANTITATIVE (NOT AT [facility])  POC URINE PREG, ED    EKG EKG Interpretation  Date/Time:  Monday October 31 2018 19:38:15 EDT Ventricular Rate:  109 PR Interval:  140 QRS Duration: 72 QT Interval:  322 QTC Calculation: 433 R Axis:   72 Text Interpretation:  Sinus tachycardia T wave abnormality, consider inferior ischemia Abnormal ECG When compared with ECG of 09/20/2015, Nonspecific T wave abnormality is now present Confirmed by Dione Booze ([id number]) on 11/01/2018 6:41:24 AM   Radiology Dg Chest 2 View  Result Date: 10/31/2018 CLINICAL DATA:  Shortness of breath EXAM: CHEST - 2 VIEW COMPARISON:  None. FINDINGS: Lungs are clear. Heart size and pulmonary vascularity are normal. No adenopathy. No bone lesions. IMPRESSION: No edema or consolidation. Electronically Signed   By: Bretta Bang III M.D.   On: 10/31/2018 20:05    Procedures Procedures  Medications Ordered in ED Medications  nitrofurantoin (macrocrystal-monohydrate) (MACROBID) capsule 100 mg (has no administration in time range)     Initial Impression / Assessment and Plan / ED Course  I have reviewed the triage vital signs and the nursing notes.  Pertinent labs & imaging results that were available during my care of the patient were reviewed by me and considered in my medical decision making (see chart for details).  Left-sided posterior chest pain which actually seems to be flank pain.  This is felt to be most likely secondary to urinary tract infection, possible urolithiasis.  Doubt pulmonary embolism.  Old records are reviewed, and prior pulmonary embolism was during pregnancy.  She has no provocative factors for thromboembolism currently.  Chest x-ray is normal.  Will check urinalysis and D-dimer.  ECG shows nonspecific T wave changes.  D-dimer is normal as are CBC and metabolic panel no evidence of DVT or pulmonary embolism.  Urinalysis does have positive nitrite and  she will be treated for UTI.  I do believe this is what is causing her flank and chest pain.  She is discharged with prescription for nitrofurantoin.  Final Clinical Impressions(s) / ED Diagnoses   Final diagnoses:  Urinary tract infection without hematuria, site unspecified  Left flank pain    ED Discharge Orders         Ordered    nitrofurantoin, macrocrystal-monohydrate, (MACROBID) 100 MG capsule  2 times daily     11/01/18 6195           Dione Booze, MD 11/01/18 [phone]

## 2018-11-01 NOTE — ED Notes (Signed)
Discharge instructions reviewed, pt verbalized understanding.

## 2018-11-07 ENCOUNTER — Ambulatory Visit (HOSPITAL_COMMUNITY)
Admission: EM | Admit: 2018-11-07 | Discharge: 2018-11-07 | Disposition: A | Discharge status: Home / Self Care | Payer: HRSA Program | Attending: Emergency Medicine | Admitting: Emergency Medicine

## 2018-11-07 ENCOUNTER — Other Ambulatory Visit: Payer: Self-pay

## 2018-11-07 ENCOUNTER — Encounter (HOSPITAL_COMMUNITY): Payer: Self-pay

## 2018-11-07 DIAGNOSIS — Z1159 Encounter for screening for other viral diseases: Secondary | ICD-10-CM

## 2018-11-07 DIAGNOSIS — Z0189 Encounter for other specified special examinations: Secondary | ICD-10-CM | POA: Diagnosis not present

## 2018-11-07 DIAGNOSIS — Z20828 Contact with and (suspected) exposure to other viral communicable diseases: Secondary | ICD-10-CM | POA: Insufficient documentation

## 2018-11-07 NOTE — ED Triage Notes (Signed)
Pt states she wants a Covid test, as her husband is having diarrhea, headache and body aches. Pt denies any sign and symptoms.

## 2018-11-07 NOTE — Discharge Instructions (Addendum)
Your COVID test is pending.  You should self quarantine until your test result is back and is negative.   ° °Go to the emergency department if you develop high fever, shortness of breath, severe diarrhea, or other concerning symptoms.   ° °

## 2018-11-07 NOTE — ED Provider Notes (Signed)
[facility]    CSN: [id number] Arrival date & time: 11/07/18  0946      History   Chief Complaint Chief Complaint  Patient presents with  . covid test    HPI Sonya Foster is a 28 y.o. female.   Patient presents with request for COVID test.  Her husband is symptomatic with diarrhea, body aches, headache, fatigue.  Patient is asymptomatic.  She denies fever, chills, cough, shortness of breath, vomiting, diarrhea, or other symptoms.  The history is provided by the patient.    Past Medical History:  Diagnosis Date  . Abnormal Pap smear   . Genital HSV   . History of chlamydia   . Late prenatal care     There are no active problems to display for this patient.   History reviewed. No pertinent surgical history.  OB History    Gravida  4   Para  4   Term  4   Preterm      AB      Living  4     SAB      TAB      Ectopic      Multiple  0   Live Births  4            Home Medications    Prior to Admission medications   Medication Sig Start Date End Date Taking? Authorizing Provider  nitrofurantoin, macrocrystal-monohydrate, (MACROBID) 100 MG capsule Take 1 capsule (100 mg total) by mouth 2 (two) times daily. X 7 days 11/01/18  Yes Dione Booze, MD    Family History Family History  Problem Relation Age of Onset  . Diabetes Maternal Grandmother   . Hypertension Maternal Grandmother   . Diabetes Maternal Grandfather   . Hypertension Maternal Grandfather     Social History Social History   Tobacco Use  . Smoking status: Former Smoker    Quit date: 2017    Years since quitting: 3.8  . Smokeless tobacco: Never Used  Substance Use Topics  . Alcohol use: No    Comment: early in the pregnancy  . Drug use: No     Allergies   Patient has no known allergies.   Review of Systems Review of Systems  Constitutional: Negative for chills and fever.  HENT: Negative for congestion, ear pain, rhinorrhea and sore throat.   Eyes:  Negative for pain and visual disturbance.  Respiratory: Negative for cough and shortness of breath.   Cardiovascular: Negative for chest pain and palpitations.  Gastrointestinal: Negative for abdominal pain, diarrhea, nausea and vomiting.  Genitourinary: Negative for dysuria and hematuria.  Musculoskeletal: Negative for arthralgias and back pain.  Skin: Negative for color change and rash.  Neurological: Negative for seizures and syncope.  All other systems reviewed and are negative.    Physical Exam Triage Vital Signs ED Triage Vitals  Enc Vitals Group     BP      Pulse      Resp      Temp      Temp src      SpO2      Weight      Height      Head Circumference      Peak Flow      Pain Score      Pain Loc      Pain Edu?      Excl. in GC?    No data found.  Updated Vital Signs BP (!) 101/59 (BP  Location: Left Arm)   Pulse 71   Temp 98.4 F (36.9 C) (Temporal)   Resp 16   LMP 10/24/2018 (Approximate)   SpO2 99%   Visual Acuity Right Eye Distance:   Left Eye Distance:   Bilateral Distance:    Right Eye Near:   Left Eye Near:    Bilateral Near:     Physical Exam Vitals signs and nursing note reviewed.  Constitutional:      General: She is not in acute distress.    Appearance: She is well-developed.  HENT:     Head: Normocephalic and atraumatic.     Right Ear: Tympanic membrane normal.     Left Ear: Tympanic membrane normal.     Nose: Nose normal.     Mouth/Throat:     Mouth: Mucous membranes are moist.     Pharynx: Oropharynx is clear.  Eyes:     Conjunctiva/sclera: Conjunctivae normal.  Neck:     Musculoskeletal: Neck supple.  Cardiovascular:     Rate and Rhythm: Normal rate and regular rhythm.     Heart sounds: No murmur.  Pulmonary:     Effort: Pulmonary effort is normal. No respiratory distress.     Breath sounds: Normal breath sounds. No wheezing or rhonchi.  Abdominal:     Palpations: Abdomen is soft.     Tenderness: There is no abdominal  tenderness. There is no guarding or rebound.  Skin:    General: Skin is warm and dry.     Findings: No rash.  Neurological:     General: No focal deficit present.     Mental Status: She is alert and oriented to person, place, and time.      UC Treatments / Results  Labs (all labs ordered are listed, but only abnormal results are displayed) Labs Reviewed  NOVEL CORONAVIRUS, NAA (HOSP ORDER, SEND-OUT TO REF LAB; TAT 18-24 HRS)    EKG   Radiology No results found.  Procedures Procedures (including critical care time)  Medications Ordered in UC Medications - No data to display  Initial Impression / Assessment and Plan / UC Course  I have reviewed the triage vital signs and the nursing notes.  Pertinent labs & imaging results that were available during my care of the patient were reviewed by me and considered in my medical decision making (see chart for details).    Patient request for COVID test.  Patient is asymptomatic.  COVID test performed here.  Instructed patient to self quarantine until her test results are back.  Instructed patient to go to the emergency department if she develops high fever, shortness of breath, severe diarrhea, or other concerning symptoms.  Patient agrees with plan of care.   Final Clinical Impressions(s) / UC Diagnoses   Final diagnoses:  Patient request for diagnostic testing     Discharge Instructions     Your COVID test is pending.  You should self quarantine until your test result is back and is negative.    Go to the emergency department if you develop high fever, shortness of breath, severe diarrhea, or other concerning symptoms.       ED Prescriptions    None     PDMP not reviewed this encounter.   Sharion Balloon, NP 11/07/18 1059

## 2018-11-09 LAB — NOVEL CORONAVIRUS, NAA (HOSP ORDER, SEND-OUT TO REF LAB; TAT 18-24 HRS): SARS-CoV-2, NAA: NOT DETECTED

## 2019-02-14 ENCOUNTER — Emergency Department (HOSPITAL_COMMUNITY)
Admission: EM | Admit: 2019-02-14 | Discharge: 2019-02-14 | Disposition: A | Discharge status: Home / Self Care | Payer: Self-pay | Attending: Emergency Medicine | Admitting: Emergency Medicine

## 2019-02-14 ENCOUNTER — Encounter (HOSPITAL_COMMUNITY): Payer: Self-pay | Admitting: Emergency Medicine

## 2019-02-14 ENCOUNTER — Other Ambulatory Visit: Payer: Self-pay

## 2019-02-14 DIAGNOSIS — B379 Candidiasis, unspecified: Secondary | ICD-10-CM

## 2019-02-14 DIAGNOSIS — Z87891 Personal history of nicotine dependence: Secondary | ICD-10-CM | POA: Insufficient documentation

## 2019-02-14 DIAGNOSIS — N12 Tubulo-interstitial nephritis, not specified as acute or chronic: Secondary | ICD-10-CM | POA: Insufficient documentation

## 2019-02-14 DIAGNOSIS — B373 Candidiasis of vulva and vagina: Secondary | ICD-10-CM | POA: Insufficient documentation

## 2019-02-14 DIAGNOSIS — Z711 Person with feared health complaint in whom no diagnosis is made: Secondary | ICD-10-CM

## 2019-02-14 DIAGNOSIS — Z202 Contact with and (suspected) exposure to infections with a predominantly sexual mode of transmission: Secondary | ICD-10-CM | POA: Insufficient documentation

## 2019-02-14 LAB — WET PREP, GENITAL
Sperm: NONE SEEN
Trich, Wet Prep: NONE SEEN

## 2019-02-14 LAB — URINALYSIS, ROUTINE W REFLEX MICROSCOPIC
Bilirubin Urine: NEGATIVE
Glucose, UA: NEGATIVE mg/dL
Ketones, ur: NEGATIVE mg/dL
Nitrite: NEGATIVE
Protein, ur: 100 mg/dL — AB
RBC / HPF: >50 RBC/hpf — ABNORMAL HIGH (ref 0–5)
Specific Gravity, Urine: 1.011 (ref 1.005–1.030)
WBC, UA: >50 WBC/hpf — ABNORMAL HIGH (ref 0–5)
pH: 5 (ref 5.0–8.0)

## 2019-02-14 LAB — I-STAT BETA HCG BLOOD, ED (MC, WL, AP ONLY): I-stat hCG, quantitative: <5 m[IU]/mL (ref <5)

## 2019-02-14 MED ORDER — CEPHALEXIN 500 MG PO CAPS: 500.0000 mg | ORAL_CAPSULE | Freq: Three times a day (TID) | ORAL | 0 refills | Status: DC

## 2019-02-14 MED ORDER — FLUCONAZOLE 150 MG PO TABS
150.0000 mg | ORAL_TABLET | Freq: Once | ORAL | Status: AC
  Administered 2019-02-14: 150 mg via ORAL
  Filled 2019-02-14: qty 1

## 2019-02-14 MED ORDER — OXYCODONE-ACETAMINOPHEN 5-325 MG PO TABS
1.0000 | ORAL_TABLET | Freq: Once | ORAL | Status: AC
  Administered 2019-02-14: 1 via ORAL
  Filled 2019-02-14: qty 1

## 2019-02-14 MED ORDER — DOXYCYCLINE HYCLATE 100 MG PO TABS
100.0000 mg | ORAL_TABLET | Freq: Once | ORAL | Status: AC
  Administered 2019-02-14: 100 mg via ORAL
  Filled 2019-02-14: qty 1

## 2019-02-14 MED ORDER — CEFTRIAXONE SODIUM 500 MG IJ SOLR
500.0000 mg | Freq: Once | INTRAMUSCULAR | Status: AC
  Administered 2019-02-14: 500 mg via INTRAMUSCULAR
  Filled 2019-02-14: qty 500

## 2019-02-14 MED ORDER — STERILE WATER FOR INJECTION IJ SOLN
INTRAMUSCULAR | Status: AC
  Filled 2019-02-14: qty 10

## 2019-02-14 MED ORDER — IBUPROFEN 600 MG PO TABS: 600.0000 mg | ORAL_TABLET | Freq: Four times a day (QID) | ORAL | 0 refills | Status: DC | PRN

## 2019-02-14 MED ORDER — IBUPROFEN 400 MG PO TABS
600.0000 mg | ORAL_TABLET | Freq: Once | ORAL | Status: AC
  Administered 2019-02-14: 600 mg via ORAL
  Filled 2019-02-14: qty 1

## 2019-02-14 MED ORDER — DOXYCYCLINE HYCLATE 100 MG PO CAPS: 100.0000 mg | ORAL_CAPSULE | Freq: Two times a day (BID) | ORAL | 0 refills | Status: DC

## 2019-02-14 NOTE — ED Triage Notes (Signed)
Pt c/o lower back pain, pain with urination, vaginal itching and burning. Denies injury/fall.

## 2019-02-14 NOTE — Discharge Instructions (Addendum)
You were seen today for back pain, urinary symptoms, concerns for yeast infection.  Your urine appears infected.  You will be treated for a kidney infection.  If you develop worsening pain, fevers, nausea, or vomiting you should be reevaluated.  Additionally you do have evidence of yeast on your wet prep.  You were treated with Diflucan in the emergency room.  You were also tested and treated for STDs.  Take antibiotics as prescribed.  Abstain from sexual activity for the next 14 days.

## 2019-02-14 NOTE — ED Notes (Signed)
Discharge instructions discussed with pt. Pt verbalized understanding with no questions at this time.  

## 2019-02-14 NOTE — ED Provider Notes (Signed)
[facility] EMERGENCY DEPARTMENT Provider Note   CSN: [id number] Arrival date & time: 02/14/19  7782     History Chief Complaint  Patient presents with  . Back Pain    Sonya Foster is a 29 y.o. female.  HPI     This is a 29 year old female who presents with back pain and urinary urgency.  Patient reports onset of symptoms yesterday.  She states that it started in her right back but now seems to radiate into her left back as well.  She has had some urinary urgency but no frequency or dysuria.  She does report vaginal "irritation."  Denies any vaginal discharge.  She states "I hope it is not an STD."  She denies any new sexual partners.  Patient denies history of kidney stone.  She rates her pain at 10 out of 10.  She did not take anything for pain.  Denies fever, nausea, vomiting, lower abdominal pain.  Past Medical History:  Diagnosis Date  . Abnormal Pap smear   . Genital HSV   . History of chlamydia   . Late prenatal care     There are no problems to display for this patient.   History reviewed. No pertinent surgical history.   OB History    Gravida  4   Para  4   Term  4   Preterm      AB      Living  4     SAB      TAB      Ectopic      Multiple  0   Live Births  4           Family History  Problem Relation Age of Onset  . Diabetes Maternal Grandmother   . Hypertension Maternal Grandmother   . Diabetes Maternal Grandfather   . Hypertension Maternal Grandfather     Social History   Tobacco Use  . Smoking status: Former Smoker    Quit date: 2017    Years since quitting: 4.0  . Smokeless tobacco: Never Used  Substance Use Topics  . Alcohol use: No    Comment: early in the pregnancy  . Drug use: No    Home Medications Prior to Admission medications   Medication Sig Start Date End Date Taking? Authorizing Provider  cephALEXin (KEFLEX) 500 MG capsule Take 1 capsule (500 mg total) by mouth 3 (three) times daily.  02/14/19   Patsye Sullivant, Mayer Masker, MD  doxycycline (VIBRAMYCIN) 100 MG capsule Take 1 capsule (100 mg total) by mouth 2 (two) times daily. 02/14/19   Cameran Ahmed, Mayer Masker, MD  ibuprofen (ADVIL) 600 MG tablet Take 1 tablet (600 mg total) by mouth every 6 (six) hours as needed. 02/14/19   Alex Mcmanigal, Mayer Masker, MD  nitrofurantoin, macrocrystal-monohydrate, (MACROBID) 100 MG capsule Take 1 capsule (100 mg total) by mouth 2 (two) times daily. X 7 days Patient not taking: Reported on 02/14/2019 11/01/18   Dione Booze, MD    Allergies    Patient has no known allergies.  Review of Systems   Review of Systems  Constitutional: Negative for fever.  Respiratory: Negative for shortness of breath.   Cardiovascular: Negative for chest pain.  Gastrointestinal: Negative for abdominal pain, nausea and vomiting.  Genitourinary: Positive for urgency and vaginal pain. Negative for dysuria, frequency, hematuria and vaginal discharge.  Musculoskeletal: Positive for back pain.  Neurological: Negative for dizziness.  All other systems reviewed and are negative.   Physical Exam  Updated Vital Signs BP 106/72   Pulse 96   Temp 98.6 F (37 C) (Oral)   Resp 18   SpO2 99%   Physical Exam Vitals and nursing note reviewed.  Constitutional:      Appearance: She is well-developed. She is not ill-appearing.  HENT:     Head: Normocephalic and atraumatic.     Mouth/Throat:     Mouth: Mucous membranes are moist.  Eyes:     Pupils: Pupils are equal, round, and reactive to light.  Cardiovascular:     Rate and Rhythm: Normal rate and regular rhythm.     Heart sounds: Normal heart sounds.  Pulmonary:     Effort: Pulmonary effort is normal. No respiratory distress.     Breath sounds: No wheezing.  Abdominal:     General: Bowel sounds are normal.     Palpations: Abdomen is soft.     Tenderness: There is right CVA tenderness.  Genitourinary:    Comments: Copious thick vaginal discharge noted Musculoskeletal:      Cervical back: Neck supple.     Right lower leg: No edema.     Left lower leg: No edema.  Skin:    General: Skin is warm and dry.  Neurological:     Mental Status: She is alert and oriented to person, place, and time.  Psychiatric:        Mood and Affect: Mood normal.     ED Results / Procedures / Treatments   Labs (all labs ordered are listed, but only abnormal results are displayed) Labs Reviewed  WET PREP, GENITAL - Abnormal; Notable for the following components:      Result Value   Yeast Wet Prep HPF POC PRESENT (*)    Clue Cells Wet Prep HPF POC PRESENT (*)    WBC, Wet Prep HPF POC MANY (*)    All other components within normal limits  URINALYSIS, ROUTINE W REFLEX MICROSCOPIC - Abnormal; Notable for the following components:   APPearance CLOUDY (*)    Hgb urine dipstick LARGE (*)    Protein, ur 100 (*)    Leukocytes,Ua LARGE (*)    RBC / HPF >50 (*)    WBC, UA >50 (*)    Bacteria, UA FEW (*)    All other components within normal limits  URINE CULTURE  I-STAT BETA HCG BLOOD, ED ([facility], [facility], [facility] ONLY)  GC/CHLAMYDIA PROBE AMP ([place]) NOT AT [facility]    EKG None  Radiology No results found.  Procedures Procedures (including critical care time)  Medications Ordered in ED Medications  cefTRIAXone (ROCEPHIN) injection 500 mg (500 mg Intramuscular Given 02/14/19 0338)  doxycycline (VIBRA-TABS) tablet 100 mg (100 mg Oral Given 02/14/19 0338)  sterile water (preservative free) injection (  Given 02/14/19 0338)  oxyCODONE-acetaminophen (PERCOCET/ROXICET) 5-325 MG per tablet 1 tablet (1 tablet Oral Given 02/14/19 0451)  ibuprofen (ADVIL) tablet 600 mg (600 mg Oral Given 02/14/19 0451)  fluconazole (DIFLUCAN) tablet 150 mg (150 mg Oral Given 02/14/19 7096)    ED Course  I have reviewed the triage vital signs and the nursing notes.  Pertinent labs & imaging results that were available during my care of the patient were reviewed by me and considered in my medical decision  making (see chart for details).    MDM Rules/Calculators/A&P                       Patient presents with back pain, urinary symptoms, vaginal complaints.  She is overall nontoxic and vital signs are reassuring.  She has some right-sided CVA tenderness on exam.  She also has some thick vaginal discharge consistent with yeast on her exam.  She was tested and treated for STDs with IM Rocephin and 1 dose of doxycycline.  Urinalysis appears infected with white blood cell clumps, greater than 50 white cells and greater than 50 red cells.  We will send a culture and treat.  Given that she is clinically well-appearing, do not feel she needs additional lab work at this time.  Will treat STD with doxycycline and discharged with Keflex for possible early pyelonephritis.  She was given instructions to return if worsening pain, fevers, nausea, vomiting.  After history, exam, and medical workup I feel the patient has been appropriately medically screened and is safe for discharge home. Pertinent diagnoses were discussed with the patient. Patient was given return precautions.   Final Clinical Impression(s) / ED Diagnoses Final diagnoses:  Pyelonephritis  Yeast infection  Concern about STD in female without diagnosis    Rx / DC Orders ED Discharge Orders         Ordered    doxycycline (VIBRAMYCIN) 100 MG capsule  2 times daily     02/14/19 0547    cephALEXin (KEFLEX) 500 MG capsule  3 times daily     02/14/19 0547    ibuprofen (ADVIL) 600 MG tablet  Every 6 hours PRN     02/14/19 0547           Merryl Hacker, MD 02/14/19 [phone]

## 2019-02-14 NOTE — ED Notes (Signed)
Pt made aware husband will be waiting in car when she is discharge.

## 2019-02-15 LAB — URINE CULTURE: Culture: 90000 — AB

## 2019-02-15 LAB — GC/CHLAMYDIA PROBE AMP (~~LOC~~) NOT AT ARMC
Chlamydia: NEGATIVE
Neisseria Gonorrhea: POSITIVE — AB

## 2019-02-16 ENCOUNTER — Telehealth: Payer: Self-pay | Admitting: *Deleted

## 2019-02-16 NOTE — Telephone Encounter (Signed)
Post ED Visit - Positive Culture Follow-up  Culture report reviewed by antimicrobial stewardship pharmacist: [facility] []  , Pharm.D. []  Enzo Bi, Pharm.D., BCPS AQ-ID []  , Pharm.D., BCPS []  Celedonio Miyamoto, Pharm.D., BCPS []  Annetta, Garvin Fila.D., BCPS, AAHIVP []  , Pharm.D., BCPS, AAHIVP [x]  Georgina Pillion, PharmD, BCPS []  , PharmD, BCPS []  [place], PharmD, BCPS []  [address], PharmD []  , PharmD, BCPS []  Estella Husk, PharmD  Pharmacy Team []  Lysle Pearl, PharmD []  , PharmD []  Phillips Climes, PharmD []  , Rph []  Agapito Games) , PharmD []  Verlan Friends, PharmD []  , PharmD []  Mervyn Gay, PharmD []  , PharmD []  Vinnie Level, PharmD []  [facility], PharmD []  , PharmD []  Len Childs, PharmD   Positive urine culture Treated with Cephalexin, organism sensitive to the same and no further patient follow-up is required at this time.  [facility] 02/16/2019, 12:27 PM

## 2020-05-19 ENCOUNTER — Emergency Department (HOSPITAL_COMMUNITY)
Admission: EM | Admit: 2020-05-19 | Discharge: 2020-05-19 | Disposition: A | Discharge status: Home / Self Care | Payer: Medicaid Other | Attending: Emergency Medicine | Admitting: Emergency Medicine

## 2020-05-19 ENCOUNTER — Ambulatory Visit (HOSPITAL_COMMUNITY)
Admission: EM | Admit: 2020-05-19 | Discharge: 2020-05-19 | Payer: Medicaid Other | Attending: Urgent Care | Admitting: Urgent Care

## 2020-05-19 ENCOUNTER — Other Ambulatory Visit: Payer: Self-pay

## 2020-05-19 ENCOUNTER — Emergency Department (HOSPITAL_COMMUNITY): Payer: Medicaid Other

## 2020-05-19 ENCOUNTER — Encounter (HOSPITAL_COMMUNITY): Payer: Self-pay | Admitting: Emergency Medicine

## 2020-05-19 ENCOUNTER — Encounter (HOSPITAL_COMMUNITY): Payer: Self-pay

## 2020-05-19 DIAGNOSIS — R072 Precordial pain: Secondary | ICD-10-CM | POA: Insufficient documentation

## 2020-05-19 DIAGNOSIS — R0789 Other chest pain: Secondary | ICD-10-CM | POA: Diagnosis not present

## 2020-05-19 DIAGNOSIS — Z86711 Personal history of pulmonary embolism: Secondary | ICD-10-CM | POA: Diagnosis not present

## 2020-05-19 DIAGNOSIS — R112 Nausea with vomiting, unspecified: Secondary | ICD-10-CM

## 2020-05-19 DIAGNOSIS — F17290 Nicotine dependence, other tobacco product, uncomplicated: Secondary | ICD-10-CM | POA: Insufficient documentation

## 2020-05-19 HISTORY — DX: Other pulmonary embolism without acute cor pulmonale: I26.99

## 2020-05-19 LAB — BASIC METABOLIC PANEL
Anion gap: 5 (ref 5–15)
BUN: 10 mg/dL (ref 6–20)
CO2: 26 mmol/L (ref 22–32)
Calcium: 8.9 mg/dL (ref 8.9–10.3)
Chloride: 107 mmol/L (ref 98–111)
Creatinine, Ser: 0.64 mg/dL (ref 0.44–1.00)
GFR, Estimated: >60 mL/min (ref >60)
Glucose, Bld: 97 mg/dL (ref 70–99)
Potassium: 3.4 mmol/L — ABNORMAL LOW (ref 3.5–5.1)
Sodium: 138 mmol/L (ref 135–145)

## 2020-05-19 LAB — POCT URINALYSIS DIPSTICK, ED / UC
Bilirubin Urine: NEGATIVE
Glucose, UA: NEGATIVE mg/dL
Ketones, ur: NEGATIVE mg/dL
Leukocytes,Ua: NEGATIVE
Nitrite: NEGATIVE
Protein, ur: NEGATIVE mg/dL
Specific Gravity, Urine: 1.02 (ref 1.005–1.030)
Urobilinogen, UA: 1 mg/dL (ref 0.0–1.0)
pH: 6.5 (ref 5.0–8.0)

## 2020-05-19 LAB — D-DIMER, QUANTITATIVE: D-Dimer, Quant: 0.45 ug/mL-FEU (ref 0.00–0.50)

## 2020-05-19 LAB — CBC
HCT: 39.3 % (ref 36.0–46.0)
Hemoglobin: 13.4 g/dL (ref 12.0–15.0)
MCH: 29.5 pg (ref 26.0–34.0)
MCHC: 34.1 g/dL (ref 30.0–36.0)
MCV: 86.6 fL (ref 80.0–100.0)
Platelets: 248 10*3/uL (ref 150–400)
RBC: 4.54 MIL/uL (ref 3.87–5.11)
RDW: 13.2 % (ref 11.5–15.5)
WBC: 5.4 10*3/uL (ref 4.0–10.5)
nRBC: 0 % (ref 0.0–0.2)

## 2020-05-19 LAB — POC URINE PREG, ED: Preg Test, Ur: NEGATIVE

## 2020-05-19 MED ORDER — ALUM & MAG HYDROXIDE-SIMETH 200-200-20 MG/5ML PO SUSP
30.0000 mL | Freq: Once | ORAL | Status: AC
  Administered 2020-05-19: 30 mL via ORAL
  Filled 2020-05-19: qty 30

## 2020-05-19 MED ORDER — PANTOPRAZOLE SODIUM 20 MG PO TBEC: 20.0000 mg | DELAYED_RELEASE_TABLET | Freq: Every day | ORAL | 0 refills | Status: DC

## 2020-05-19 MED ORDER — FAMOTIDINE 20 MG PO TABS
20.0000 mg | ORAL_TABLET | Freq: Once | ORAL | Status: AC
  Administered 2020-05-19: 20 mg via ORAL
  Filled 2020-05-19: qty 1

## 2020-05-19 MED ORDER — ACETAMINOPHEN 500 MG PO TABS
1000.0000 mg | ORAL_TABLET | Freq: Once | ORAL | Status: AC
  Administered 2020-05-19: 1000 mg via ORAL
  Filled 2020-05-19: qty 2

## 2020-05-19 MED ORDER — ONDANSETRON 4 MG PO TBDP: 4.0000 mg | ORAL_TABLET | Freq: Three times a day (TID) | ORAL | 0 refills | Status: DC | PRN

## 2020-05-19 MED ORDER — SUCRALFATE 1 G PO TABS: 1.0000 g | ORAL_TABLET | Freq: Three times a day (TID) | ORAL | 0 refills | Status: DC

## 2020-05-19 NOTE — ED Triage Notes (Signed)
Emergency Medicine Provider Triage Evaluation Note  Sonya Foster , a 30 y.o. female  was evaluated in triage.  Pt complains of mid chest pain for 1-2 months. No acute change today  Review of Systems  Positive: Chest pain Negative: No fever or sob.   Physical Exam  BP 116/78 (BP Location: Right Arm)   Pulse 72   Temp 98 F (36.7 C) (Oral)   Resp 16   LMP 05/05/2020   SpO2 100%  Gen:   Awake, no distress   HEENT:  Atraumatic  Resp:  Normal effort Cardiac:  Normal rate  Abd:   Nondistended, nontender  MSK:   No leg pain or swelling   Medical Decision Making  Medically screening exam initiated at 12:58 PM.  Appropriate orders placed.  Sonya Foster was informed that the remainder of the evaluation will be completed by another provider, this initial triage assessment does not replace that evaluation, and the importance of remaining in the ED until their evaluation is complete.  Clinical Impression  CXR, ecg. Labs. Will move to treatment room as soon as available.    Cathren Laine, MD 05/19/20 1300

## 2020-05-19 NOTE — Discharge Instructions (Addendum)
You came to the Emergency Department today to be evaluated for your chest pain, nausea, and vomiting.  Your EKG, chest x-ray, and lab work were reassuring.  Your chest pain is likely musculoskeletal in nature.  You may use over-the-counter pain medication as needed to help with the symptoms.    You received medication to help with your nausea and vomiting.  You were able to eat and drink prior to leaving the emergency department.  I have given you prescription for Zofran which you may take once every 8 hours as needed for nausea and vomiting.  I have also given you prescription for Pepcid and Carafate.  These are medications to help with acid reflux.  Please follow-up with your primary care provider.  Get help right away if: Your chest pain gets worse. You have a cough that gets worse, or you cough up blood. You have severe pain in your abdomen. You faint. You have sudden, unexplained chest discomfort. You have sudden, unexplained discomfort in your arms, back, neck, or jaw. You have shortness of breath at any time. You suddenly start to sweat, or your skin gets clammy. You feel nausea or you vomit. You suddenly feel lightheaded or dizzy. You have severe weakness, or unexplained weakness or fatigue. Your heart begins to beat quickly, or it feels like it is skipping beats.

## 2020-05-19 NOTE — ED Provider Notes (Signed)
[facility] EMERGENCY DEPARTMENT Provider Note   CSN: [id number] Arrival date & time: 05/19/20  1240     History Chief Complaint  Patient presents with  . Chest Pain    Sonya Foster is a 30 y.o. 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  Presents with chief complaint of chest pain for the last 2 months.  Patient reports her pain has been worse over the last 2 weeks.  Patient describes pain as midsternal.  Patient denies any radiation of pain.  Reports pain is worse with stretching, movement, and picking up items, deep inhalation, and palpation.  Patient reports improvement in her symptoms with rest.  Patient states that she has been more active over the last month doing heavy lifting.  Patient denies any shortness of breath or diaphoresis, associated with her chest pain.  Patient denies any leg swelling, palpitations, surgery or traumatic injuries in the last 12 weeks, hemoptysis,, history of cancer  Patient also endorses nausea and vomiting over the last 2 to 3 days.  Patient states she has 1 episode of vomiting in the last 24 hours.  Patient describes emesis as bilious.  Denies any coffee-ground emesis or bloody emesis.  Patient denies any associated abdominal pain, fevers, chills, abdominal distention, constipation, diarrhea, urinary symptoms, vaginal bleeding, vaginal discharge, vaginal pain.  Patient also endorses that she has had mucoid and bloody stools intermittently over the last 10 years.  Patient denies any acute change in the symptoms recently.    HPI     Past Medical History:  Diagnosis Date  . Abnormal Pap smear   . Genital HSV   . History of chlamydia   . Late prenatal care   . Pulmonary emboli (HCC)    pt reports a blood clot in lung during pregnancy in 2019    There are no problems to display for this patient.   History reviewed. No pertinent surgical history.   OB History    Gravida  4   Para  4    Term  4   Preterm      AB      Living  4     SAB      IAB      Ectopic      Multiple  0   Live Births  4           Family History  Problem Relation Age of Onset  . Diabetes Maternal Grandmother   . Hypertension Maternal Grandmother   . Diabetes Maternal Grandfather   . Hypertension Maternal Grandfather     Social History   Tobacco Use  . Smoking status: Current Every Day Smoker    Types: Cigars    Last attempt to quit: 2017    Years since quitting: 5.3  . Smokeless tobacco: Never Used  . Tobacco comment: 4-5 black and mild cigars per day  Substance Use Topics  . Alcohol use: Yes    Comment: occasionally  . Drug use: Yes    Types: Marijuana    Comment: daily    Home Medications Prior to Admission medications   Medication Sig Start Date End Date Taking? Authorizing Provider  ibuprofen (ADVIL) 600 MG tablet Take 1 tablet (600 mg total) by mouth every 6 (six) hours as needed. 02/14/19   Horton, Mayer Masker, MD    Allergies    Patient has no known allergies.  Review of Systems   Review of Systems  Constitutional: Negative  for chills, diaphoresis and fever.  Eyes: Negative for visual disturbance.  Respiratory: Negative for shortness of breath.   Cardiovascular: Positive for chest pain. Negative for palpitations and leg swelling.  Gastrointestinal: Positive for blood in stool, nausea and vomiting. Negative for abdominal distention, abdominal pain, anal bleeding, constipation, diarrhea and rectal pain.  Genitourinary: Negative for decreased urine volume, difficulty urinating, dysuria, flank pain, frequency, genital sores, hematuria, menstrual problem, pelvic pain, urgency, vaginal bleeding, vaginal discharge and vaginal pain.  Musculoskeletal: Negative for back pain and neck pain.  Skin: Negative for color change and rash.  Neurological: Negative for dizziness, syncope, light-headedness and headaches.  Psychiatric/Behavioral: Negative for confusion.     Physical Exam Updated Vital Signs BP 113/86 (BP Location: Right Arm)   Pulse 70   Temp 98 F (36.7 C) (Oral)   Resp 17   LMP 05/05/2020   SpO2 100%   Physical Exam Vitals and nursing note reviewed.  Constitutional:      General: She is not in acute distress.    Appearance: She is not ill-appearing, toxic-appearing or diaphoretic.  HENT:     Head: Normocephalic.  Eyes:     General: No scleral icterus.       Right eye: No discharge.        Left eye: No discharge.  Cardiovascular:     Rate and Rhythm: Normal rate.     Heart sounds: Normal heart sounds.  Pulmonary:     Effort: Pulmonary effort is normal. No tachypnea, accessory muscle usage or respiratory distress.     Breath sounds: Normal breath sounds.     Comments: Patient able to speak in full complete sentences without difficulty Chest:     Chest wall: Tenderness present. No mass, lacerations, deformity, swelling, crepitus or edema.       Comments: Chest wall tenderness Abdominal:     General: Bowel sounds are normal. There is distension. There are no signs of injury.     Palpations: Abdomen is soft. There is no mass or pulsatile mass.     Tenderness: There is no abdominal tenderness. There is no guarding or rebound.     Hernia: There is no hernia in the umbilical area or ventral area.  Musculoskeletal:     Cervical back: Normal range of motion and neck supple.     Right lower leg: No tenderness. No edema.     Left lower leg: No tenderness. No edema.  Skin:    General: Skin is warm and dry.     Coloration: Skin is not cyanotic or pale.  Neurological:     General: No focal deficit present.     Mental Status: She is alert.     GCS: GCS eye subscore is 4. GCS verbal subscore is 5. GCS motor subscore is 6.  Psychiatric:        Behavior: Behavior is cooperative.     ED Results / Procedures / Treatments   Labs (all labs ordered are listed, but only abnormal results are displayed) Labs Reviewed  BASIC  METABOLIC PANEL - Abnormal; Notable for the following components:      Result Value   Potassium 3.4 (*)    All other components within normal limits  CBC  D-DIMER, QUANTITATIVE (NOT AT [facility])    EKG EKG Interpretation  Date/Time:  Sunday May 19 2020 12:56:36 EDT Ventricular Rate:  61 PR Interval:  150 QRS Duration: 72 QT Interval:  354 QTC Calculation: 356 R Axis:   74 Text Interpretation:  Normal sinus rhythm Normal ECG Confirmed by Cathren Laine ([id number]) on 05/19/2020 12:58:09 PM   Radiology DG Chest 2 View  Result Date: 05/19/2020 CLINICAL DATA:  Mid chest pain for 1-2 months. EXAM: CHEST - 2 VIEW COMPARISON:  Chest x-ray dated 10/31/2018 FINDINGS: Heart size and mediastinal contours are within normal limits. Lungs are clear. No pleural effusion or pneumothorax is seen. Osseous structures about the chest are unremarkable. IMPRESSION: Normal chest x-ray. Electronically Signed   By: Bary Richard M.D.   On: 05/19/2020 13:58    Procedures Procedures   Medications Ordered in ED Medications  acetaminophen (TYLENOL) tablet 1,000 mg (1,000 mg Oral Given 05/19/20 1618)  alum & mag hydroxide-simeth (MAALOX/MYLANTA) 200-200-20 MG/5ML suspension 30 mL (30 mLs Oral Given 05/19/20 1618)  famotidine (PEPCID) tablet 20 mg (20 mg Oral Given 05/19/20 1618)    ED Course  I have reviewed the triage vital signs and the nursing notes.  Pertinent labs & imaging results that were available during my care of the patient were reviewed by me and considered in my medical decision making (see chart for details).    MDM Rules/Calculators/A&P                          Alert 30 year old female no acute distress, nontoxic-appearing.  Patient resting comfortably in hospital bed.  Patient presents with chief complaint of chest pain.  Chest pain has been present over the last 2 months.  Patient reports pain has been worse in the last 2 weeks.  Pain is worse with stretching, movement, picking up items, deep  inhalation, and palpitation.  Patient reports that she has been more active lifting heavy things in the last month.  Patient also complains of nausea and vomiting over the last 2 to 3 days.  Patient has no episodes of vomiting while in the emergency department.  BMP and CBC are unremarkable.  Patient is afebrile and hemodynamically stable.  Abdomen is soft, nondistended, nontender.  Low suspicion for intra-abdominal infection, acute pancreatitis, or appendicitis causing patient's nausea and vomiting.  EKG shows normal sinus rhythm Chest x-ray shows no active cardiopulmonary disease. D-dimer within normal limits.  Chest pain is reproducible with movement, deep inhalation, and palpation.  Low suspicion for ACS. D-dimer within normal limits low suspicion for PE.  Patient was given Tylenol, and GI cocktail.  Patient reports improvement in her symptoms.    Will discharge patient with prescription for Zofran, Protonix, and Carafate.  Discussed results, findings, treatment and follow up. Patient advised of return precautions. Patient verbalized understanding and agreed with plan.   Final Clinical Impression(s) / ED Diagnoses Final diagnoses:  Precordial chest pain  Non-intractable vomiting with nausea, unspecified vomiting type    Rx / DC Orders ED Discharge Orders         Ordered    ondansetron (ZOFRAN ODT) 4 MG disintegrating tablet  Every 8 hours PRN        05/19/20 1657    pantoprazole (PROTONIX) 20 MG tablet  Daily        05/19/20 1657    sucralfate (CARAFATE) 1 g tablet  3 times daily with meals & bedtime        05/19/20 1657           Haskel Schroeder, PA-C 05/20/20 [id number]    Benjiman Core, MD 05/21/20 [phone]

## 2020-05-19 NOTE — ED Triage Notes (Addendum)
Pt reports intermittent blood and mucus in stool for 5 years.   Pt also reports pressure with urination, vaginal irritation.  Additionally, pt reports bad toothache 2 days ago with swelling on face (lower left molar area, grayness to gum area). States got root canal here as a teenager.   Additionally, pt reports 4 days of nausea with 3 episodes of emesis. Pt also reports lower abdominal pain.  Pt also reports 2 months of chest pain on left side with some pain around to left shoulder blade. Reports pain is sharp and shooting. States noted some chest swelling initially. Pt reports pain worsens with cough and deep breath or when leaning over. Reports history of blood clot in left lung. Also states was told that part of her left lung was dying due to decreased oxygen so was placed on blood thinners during pregnancy. Pt is no longer taking blood thinners. This was in 2019.

## 2020-05-19 NOTE — Discharge Instructions (Addendum)
Please report to the emergency room as you will need a chest CT angiogram to evaluate for a recurrent pulmonary embolism (chest clot). Do not go home, go straight to the emergency.

## 2020-05-19 NOTE — ED Provider Notes (Signed)
Patient seen briefly through triage. She has a history of multiple pulmonary emboli in pregnancy and is now reporting 2 month history of chest pain with deep inspiration similar to the chest pain she had when she suffered the pulmonary emboli. I advised that she report to the emergency room for further work up and rule out of chest PE.    Wallis Bamberg, PA-C 05/19/20 1246

## 2020-05-19 NOTE — ED Notes (Signed)
Patient is being discharged from the Urgent Care and sent to the Emergency Department via personal vehicle. Per provider Wallis Bamberg, patient is in need of higher level of care due to needing further evaluation for possible PE. Patient is aware and verbalizes understanding of plan of care.   Vitals:   05/19/20 1222  BP: 110/76  Pulse: 76  Resp: 18  Temp: 98.2 F (36.8 C)  SpO2: 97%

## 2020-05-19 NOTE — ED Triage Notes (Signed)
Pt sent from [facility].  Reports mucous and blood in stool for 5 years.  States she just read an article about colon cancer and wants to get checked.    Reports L sided chest pain that radiates to back x 2 months.

## 2020-07-09 ENCOUNTER — Other Ambulatory Visit: Payer: Self-pay

## 2020-07-09 ENCOUNTER — Ambulatory Visit (INDEPENDENT_AMBULATORY_CARE_PROVIDER_SITE_OTHER): Payer: Medicaid Other | Admitting: Family Medicine

## 2020-07-09 VITALS — HR 98 | Ht 62.0 in | Wt 135.5 lb

## 2020-07-09 DIAGNOSIS — R079 Chest pain, unspecified: Secondary | ICD-10-CM | POA: Diagnosis not present

## 2020-07-09 DIAGNOSIS — R109 Unspecified abdominal pain: Secondary | ICD-10-CM | POA: Diagnosis not present

## 2020-07-09 DIAGNOSIS — O88219 Thromboembolism in pregnancy, unspecified trimester: Secondary | ICD-10-CM | POA: Insufficient documentation

## 2020-07-09 DIAGNOSIS — Z0000 Encounter for general adult medical examination without abnormal findings: Secondary | ICD-10-CM | POA: Diagnosis present

## 2020-07-09 LAB — POCT URINALYSIS DIP (MANUAL ENTRY)
Bilirubin, UA: NEGATIVE
Blood, UA: NEGATIVE
Glucose, UA: NEGATIVE mg/dL
Ketones, POC UA: NEGATIVE mg/dL
Leukocytes, UA: NEGATIVE
Nitrite, UA: NEGATIVE
Protein Ur, POC: NEGATIVE mg/dL
Spec Grav, UA: 1.02 (ref 1.010–1.025)
Urobilinogen, UA: 0.2 E.U./dL
pH, UA: 8 (ref 5.0–8.0)

## 2020-07-09 LAB — POCT URINE PREGNANCY: Preg Test, Ur: NEGATIVE

## 2020-07-09 MED ORDER — IBUPROFEN 200 MG PO TABS: 400.0000 mg | ORAL_TABLET | Freq: Three times a day (TID) | ORAL | 0 refills | Status: DC | PRN

## 2020-07-09 MED ORDER — DICLOFENAC SODIUM 1 % EX GEL: 2.0000 g | Freq: Four times a day (QID) | CUTANEOUS | 0 refills | Status: DC

## 2020-07-09 NOTE — Progress Notes (Signed)
Subjective:    Patient ID: Sonya Foster, female    DOB: Feb 15, 1990, 30 y.o.   MRN: [id number]   CC: New Patient  HPI:  Current concerns include:  Chest pain   CHEST DISCOMFORT  Discomfort Characteristics Character (burning, tearing, pressure, pleuritic): pleuritic and also shoulder blade pain. She was lifting heavy boxes 2 months ago when she first noticed the pain  Provoking activities: moving left shoulder and left arm, sleeping on that side  Relieving activities: ibuprofen, tylenol  Onset and Course:  left sided Duration: 6 months, constant  Location Radiation: none  Effect on function: none   When she was diagnosed with PE she presented with a cough.   Associated Symptoms Shortness of Breath: none  Leg swelling: none  Recent Immobilization: none  Hemoptysis: none  Nausea Diaphoresis: a few days ago  Reflux symptoms: yes  Fever: none  Cough Productive: a little Edema:  none   Pt thinks might be: MSK pain    PMH CAD history: none  Cancer history:  none  Pulmonary Emboli history: yes   Decision Calculators     Wells' PE criteria   1.5 for previous PE/DVT    PMH: Past Medical History:  Diagnosis Date   Abnormal Pap smear    Genital HSV    History of chlamydia    Late prenatal care    Pulmonary emboli (HCC)    pt reports a blood clot in lung during pregnancy in 2019    PSH: None   DH: Hx of anticoagulation for 6 months when pt had PE  Ibuprofen PRN   Allergies nkda   FH: Family History  Problem Relation Age of Onset   Diabetes Maternal Grandmother    Hypertension Maternal Grandmother    Diabetes Maternal Grandfather    Hypertension Maternal Grandfather    SH:  Who lives at home: partner and 5 kids. 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 Smoker: yes-marijuana  07/10/2020  EOTH: once a month, 3-4 shots  07/10/2020  Illicit drug use: see above 07/10/2020  Current Stressors: none   07/10/2020  Preventative Screening  Pap test: 2019, possibly abnoralm Infuenza vaccine:  declined  COVID vaccine: declined  Tetanus vaccine:  2017   Smoking status reviewed  Review of Systems   Objective:  Pulse 98   Ht 5\' 2"  (1.575 m)   Wt 135 lb 8 oz (61.5 kg)   BMI 24.78 kg/m  Vitals and nursing note reviewed  General: Alert, no acute distress Cardio: Normal S1 and S2, RRR, no r/m/g Tenderness on palpation of anterior chest wall, worsened by movement of left UE Pulm: CTAB, normal work of breathing Abdomen: Bowel sounds normal. Abdomen soft and non-tender.  Extremities: No peripheral edema.  Neuro: Cranial nerves grossly intact   Assessment & Plan:    Chest pain Likely a muscle strain. Pt was seen in the ER in May for the exact same type of pain, had a full work up with negative D-dimer, CXR, EKG etc which was negative. Low suspicion for PE/ACS at that time and was thought to have MSK pain/GERD. Pt comes in with recurrence of this pain today. It is the same in terms of character and worse on movement. Cautious given hx of PE but given that it is the same type of pain low suspicion. It is also reproducible on palpation of anterior chest wall and also worse on movement of the LUE today. Recommended: NSAIDs, Voltaren gel, and  monitoring the pain. Strict ER precautions provided.   Healthcare maintenance Recommend follow up for routine health maintenance pap smear, labs etc.     No follow-ups on file.   Towanda Octave MD, PGY-2

## 2020-07-09 NOTE — Patient Instructions (Addendum)
Thank you for coming to see me today. It was a pleasure. Today we discussed your chest pain. I think it is a muscle sprain or inflammation of the ribs. I recommended tylenol, motrin and voltaren gel. I do not feel it is a clot in the lungs. You had many tests last month in the ER which were negative.   Go back to the ER if: Chest pain worsens New type of chest pain Shortness of breath, cough Fevers etc  Please follow-up with me in  2-4 weeks  If you have any questions or concerns, please do not hesitate to call the office at [phone].  Best wishes,   Dr Allena Katz

## 2020-07-10 DIAGNOSIS — Z0000 Encounter for general adult medical examination without abnormal findings: Secondary | ICD-10-CM | POA: Insufficient documentation

## 2020-07-10 DIAGNOSIS — R079 Chest pain, unspecified: Secondary | ICD-10-CM | POA: Insufficient documentation

## 2020-07-10 NOTE — Assessment & Plan Note (Signed)
Recommend follow up for routine health maintenance pap smear, labs etc.

## 2020-07-10 NOTE — Assessment & Plan Note (Addendum)
Likely a muscle strain. Pt was seen in the ER in May for the exact same type of pain, had a full work up with negative D-dimer, CXR, EKG etc which was negative. Low suspicion for PE/ACS at that time and was thought to have MSK pain/GERD. Pt comes in with recurrence of this pain today. It is the same in terms of character and worse on movement. Cautious given hx of PE but given that it is the same type of pain low suspicion. It is also reproducible on palpation of anterior chest wall and also worse on movement of the LUE today. Recommended: NSAIDs, Voltaren gel, and monitoring the pain. Strict ER precautions provided.

## 2020-07-24 ENCOUNTER — Ambulatory Visit: Payer: Medicaid Other | Admitting: Family Medicine

## 2020-07-24 NOTE — Progress Notes (Deleted)
     SUBJECTIVE:   CHIEF COMPLAINT / HPI:   Sonya Foster is a 30 y.o. female presents for pap smear  Pap smear Pt presents for pap smear.    ***  Flowsheet Row Office Visit from 07/09/2020 in [facility]  PHQ-9 Total Score 0        Health Maintenance Due  Topic   COVID-19 Vaccine (1)   Pneumococcal Vaccine 66-53 Years old (1 - PCV)   Hepatitis C Screening    PAP SMEAR-Modifier       PERTINENT  PMH / PSH:   OBJECTIVE:   There were no vitals taken for this visit.   General: Alert, no acute distress Cardio: Normal S1 and S2, RRR, no r/m/g Pulm: CTAB, normal work of breathing Abdomen: Bowel sounds normal. Abdomen soft and non-tender.  Extremities: No peripheral edema.  Neuro: Cranial nerves grossly intact   ASSESSMENT/PLAN:   No problem-specific Assessment & Plan notes found for this encounter.     Towanda Octave, MD PGY-2 [facility]

## 2020-08-02 ENCOUNTER — Ambulatory Visit: Payer: Medicaid Other | Admitting: Family Medicine

## 2020-08-22 ENCOUNTER — Ambulatory Visit: Payer: Medicaid Other | Admitting: Family Medicine

## 2020-08-22 NOTE — Progress Notes (Deleted)
     SUBJECTIVE:   CHIEF COMPLAINT / HPI:   Sonya Foster is a 30 y.o. female presents for pap  Pap    ***  Flowsheet Row Office Visit from 07/09/2020 in [facility]  PHQ-9 Total Score 0        Health Maintenance Due  Topic   COVID-19 Vaccine (1)   Pneumococcal Vaccine 53-31 Years old (1 - PCV)   Hepatitis C Screening    PAP SMEAR-Modifier    INFLUENZA VACCINE       PERTINENT  PMH / PSH:   OBJECTIVE:   There were no vitals taken for this visit.   General: Alert, no acute distress Cardio: Normal S1 and S2, RRR, no r/m/g Pulm: CTAB, normal work of breathing Abdomen: Bowel sounds normal. Abdomen soft and non-tender.  Extremities: No peripheral edema.  Neuro: Cranial nerves grossly intact   ASSESSMENT/PLAN:   No problem-specific Assessment & Plan notes found for this encounter.     Towanda Octave, MD PGY-3 [facility]

## 2021-12-29 ENCOUNTER — Other Ambulatory Visit (HOSPITAL_COMMUNITY)
Admission: RE | Admit: 2021-12-29 | Discharge: 2021-12-29 | Disposition: A | Discharge status: Home / Self Care | Payer: Medicaid Other | Source: Ambulatory Visit | Attending: Family Medicine | Admitting: Family Medicine

## 2021-12-29 ENCOUNTER — Encounter: Payer: Self-pay | Admitting: Family Medicine

## 2021-12-29 ENCOUNTER — Ambulatory Visit (INDEPENDENT_AMBULATORY_CARE_PROVIDER_SITE_OTHER): Payer: Medicaid Other | Admitting: Family Medicine

## 2021-12-29 VITALS — BP 112/89 | HR 80 | Ht 62.0 in | Wt 133.8 lb

## 2021-12-29 DIAGNOSIS — Z113 Encounter for screening for infections with a predominantly sexual mode of transmission: Secondary | ICD-10-CM

## 2021-12-29 DIAGNOSIS — Z309 Encounter for contraceptive management, unspecified: Secondary | ICD-10-CM | POA: Insufficient documentation

## 2021-12-29 DIAGNOSIS — Z0000 Encounter for general adult medical examination without abnormal findings: Secondary | ICD-10-CM

## 2021-12-29 DIAGNOSIS — Z124 Encounter for screening for malignant neoplasm of cervix: Secondary | ICD-10-CM

## 2021-12-29 DIAGNOSIS — Z1159 Encounter for screening for other viral diseases: Secondary | ICD-10-CM | POA: Diagnosis not present

## 2021-12-29 LAB — POCT WET PREP (WET MOUNT)
Clue Cells Wet Prep Whiff POC: NEGATIVE
Trichomonas Wet Prep HPF POC: ABSENT
WBC, Wet Prep HPF POC: NONE SEEN

## 2021-12-29 NOTE — Progress Notes (Signed)
    SUBJECTIVE:   CHIEF COMPLAINT / HPI:   Patient presents for physical and PAP smear today. She has been adjusting, she separated from her husband at the beginning of the year. Reports that it has been stressful but things are getting better. She has a great support system of her friends who want her to take care of her health. Sexually active and using barrier protection for contraception and interested in IUD in the future. She vapes but says that she is working on quitting.   OBJECTIVE:   BP 112/89   Pulse 80   Ht 5\' 2"  (1.575 m)   Wt 133 lb 12.8 oz (60.7 kg)   LMP 12/17/2021   SpO2 99%   BMI 24.47 kg/m   General: Patient well-appearing, in no acute distress. HEENT: non-tender thyroid  CV: RRR, no murmurs or gallops auscultated Resp: CTAB, no wheezing, rales or rhonchi noted Abdomen: soft, nontender, nondistended, presence of bowel sounds Ext: no LE edema noted bilaterally Psych: mood appropriate, pleasant  GU: no external lesions or rashes noted, normal vagina and cervix without discharge or associated odor, no adnexal masses or tenderness noted   GU exam performed in the presence of chaperone, 12/19/2021, CMA.   ASSESSMENT/PLAN:   Healthcare maintenance -PAP smear performed today  -STI testing performed today with patient preference including pending RPR and HIV testing -Hep C screening pending as patient agreeable to this as well -Med rec reviewed -Encouraged vaping cessation  -PHQ-2 score with negative question 9 reviewed and discussed.   Contraception management -discussed options for contraception management, patient prefers IUD placement which appointment was scheduled -until IUD placement, she would like to use barrier protection -safe sex counseling discussed  History of PE -discussed patient's concerns regarding this, strict ED precautions discussed and we reviewed what to look for.  -reassurance provided     Cleatrice Burke, DO [facility]

## 2021-12-29 NOTE — Patient Instructions (Signed)
It was great seeing you today!  Today we discussed many things and did a PAP smear. I will let you know of any abnormal results from the PAP and blood work. Please make sure to continue to use protection with each sexual encounter.   Follow up to get the IUD placed.   Please follow up at your next scheduled appointment, if anything arises between now and then, please don't hesitate to contact our office.   Thank you for allowing [place] to be a part of your medical care!  Thank you, Dr. Robyne Peers  Also a reminder of our clinic's no-show policy. Please make sure to arrive at least 15 minutes prior to your scheduled appointment time. Please try to cancel before 24 hours if you are not able to make it. If you no-show for 2 appointments then you will be receiving a warning letter. If you no-show after 3 visits, then you may be at risk of being dismissed from our clinic. This is to ensure that everyone is able to be seen in a timely manner. Thank you, we appreciate your assistance with this!

## 2021-12-29 NOTE — Assessment & Plan Note (Signed)
-  discussed options for contraception management, patient prefers IUD placement which appointment was scheduled -until IUD placement, she would like to use barrier protection -safe sex counseling discussed

## 2021-12-29 NOTE — Assessment & Plan Note (Addendum)
-  PAP smear performed today  -STI testing performed today with patient preference including pending RPR and HIV testing -Hep C screening pending as patient agreeable to this as well -Med rec reviewed -Encouraged vaping cessation  -PHQ-2 score with negative question 9 reviewed and discussed.

## 2021-12-30 ENCOUNTER — Encounter: Payer: Self-pay | Admitting: Family Medicine

## 2021-12-30 LAB — HCV AB W REFLEX TO QUANT PCR: HCV Ab: NONREACTIVE

## 2021-12-30 LAB — HCV INTERPRETATION

## 2021-12-30 LAB — HIV ANTIBODY (ROUTINE TESTING W REFLEX): HIV Screen 4th Generation wRfx: NONREACTIVE

## 2021-12-30 LAB — T PALLIDUM ANTIBODY, EIA: T pallidum Antibody, EIA: NEGATIVE

## 2021-12-30 LAB — RPR W/REFLEX TO TREPSURE: RPR: NONREACTIVE

## 2021-12-31 LAB — CYTOLOGY - PAP
Adequacy: ABSENT
Chlamydia: NEGATIVE
Comment: NEGATIVE
Comment: NEGATIVE
Comment: NEGATIVE
Comment: NORMAL
Diagnosis: UNDETERMINED — AB
High risk HPV: NEGATIVE
Neisseria Gonorrhea: NEGATIVE
Trichomonas: NEGATIVE

## 2022-01-07 ENCOUNTER — Ambulatory Visit: Payer: Medicaid Other | Admitting: Family Medicine

## 2022-01-23 ENCOUNTER — Encounter: Payer: Self-pay | Admitting: Family Medicine

## 2022-01-23 ENCOUNTER — Ambulatory Visit (INDEPENDENT_AMBULATORY_CARE_PROVIDER_SITE_OTHER): Payer: Medicaid Other | Admitting: Family Medicine

## 2022-01-23 VITALS — BP 115/80 | HR 68 | Temp 98.2°F | Ht 62.0 in | Wt 131.4 lb

## 2022-01-23 DIAGNOSIS — Z975 Presence of (intrauterine) contraceptive device: Secondary | ICD-10-CM

## 2022-01-23 DIAGNOSIS — Z30014 Encounter for initial prescription of intrauterine contraceptive device: Secondary | ICD-10-CM | POA: Diagnosis not present

## 2022-01-23 LAB — POCT URINE PREGNANCY: Preg Test, Ur: NEGATIVE

## 2022-01-23 NOTE — Progress Notes (Signed)
    SUBJECTIVE:   CHIEF COMPLAINT / HPI:   Contraception Management -here for IUD placement -last sexual activity was 2 months ago -reviewed negative STI testing from 12/29/21 -has had IUD in the past, does not have any questions  PERTINENT  PMH / PSH: h/o PE in pregnancy  OBJECTIVE:   Ht 5\' 2"  (1.575 m)   Wt 131 lb 6.4 oz (59.6 kg)   LMP 12/17/2021   BMI 24.03 kg/m   General: NAD, pleasant, able to participate in exam Respiratory: No respiratory distress Skin: warm and dry, no rashes noted Psych: Normal affect and mood Neuro: grossly intact GU/GYN: Exam performed in the presence of a chaperone. External genitalia within normal limits.  Vaginal mucosa pink, moist, normal rugae.  Nonfriable cervix without lesions, no discharge or bleeding noted on speculum exam.    ASSESSMENT/PLAN:   IUD INSERTION: Patient given informed consent, signed copy in the chart..  Negative pregnancy confirmed.  Appropriate time out taken.   Sterile instruments and technique was used. Cervix brought into view with use of speculum and then cleansed three times with  betadine swabs.  A tenaculum was placed into the anterior lip of the cervix and a uterine sound was used to measure uterine size.  A Mirena IUD was placed into the endometrial cavity, deployed and secured. The applicator was removed. The strings were trimmed to 2 centimeters.   There were no complications and the patient tolerated the procedure well.   She was given handouts for post procedure instructions and information about the IUD including the time of recommended removal.   Alcus Dad, MD [place]

## 2022-01-23 NOTE — Patient Instructions (Signed)
IUD AFTERCARE  1.  Uterine cramping is common after IUD placement.  You  May using heating pads, ibuprofen, and tylenol.  If it becomes very painful, you notice fever or chills, unusual bleeding, or foul smelling vaginal discharge please call the office. 2.  Irregular vaginal bleeding is common in the first few months after IUD placement but will often get better in the first six months.   3.  IUDs do not protect against STD such as HIV, genital warts, gonorrhea, chlamydia, herpes.  Please continue to protect yourself against these infections and contact the office if you think you may have contracted an infection. 4.  Checking for proper placement:  You may feel for your IUD strings by placing you fingers into your vagina.  Do not pull on the strings.  If your Mirena falls out, you can feel the hard plastic part of the IUD, or you can no longer feel the strings, please contact the office. 5.  Your Mirena should be removed or replaced in 5 years. 6.  Please see package insert or [email or web address] for full patient information. 7.  Make follow-up appointment for 4 weeks for string check.

## 2022-02-17 MED ORDER — LEVONORGESTREL 20 MCG/DAY IU IUD
1.0000 | INTRAUTERINE_SYSTEM | Freq: Once | INTRAUTERINE | Status: AC
  Administered 2022-01-23: 1 via INTRAUTERINE

## 2022-02-17 NOTE — Addendum Note (Signed)
Addended by: Talbot Grumbling on: 02/17/2022 02:04 PM   Modules accepted: Orders

## 2022-04-11 DIAGNOSIS — H5203 Hypermetropia, bilateral: Secondary | ICD-10-CM | POA: Diagnosis not present

## 2022-04-26 DIAGNOSIS — H5213 Myopia, bilateral: Secondary | ICD-10-CM | POA: Diagnosis not present

## 2022-05-07 ENCOUNTER — Ambulatory Visit: Payer: Medicaid Other | Admitting: Family Medicine

## 2022-05-07 ENCOUNTER — Encounter: Payer: Self-pay | Admitting: Family Medicine

## 2022-05-07 VITALS — BP 122/82 | HR 78 | Ht 62.0 in | Wt 128.0 lb

## 2022-05-07 DIAGNOSIS — B009 Herpesviral infection, unspecified: Secondary | ICD-10-CM | POA: Diagnosis not present

## 2022-05-07 DIAGNOSIS — M542 Cervicalgia: Secondary | ICD-10-CM | POA: Insufficient documentation

## 2022-05-07 DIAGNOSIS — F419 Anxiety disorder, unspecified: Secondary | ICD-10-CM | POA: Diagnosis not present

## 2022-05-07 DIAGNOSIS — A6000 Herpesviral infection of urogenital system, unspecified: Secondary | ICD-10-CM | POA: Diagnosis not present

## 2022-05-07 MED ORDER — VALACYCLOVIR HCL 1 G PO TABS: 1000.0000 mg | ORAL_TABLET | Freq: Two times a day (BID) | ORAL | 10 refills | Status: AC

## 2022-05-07 NOTE — Progress Notes (Signed)
    SUBJECTIVE:   CHIEF COMPLAINT / HPI:   Neck pain Relates pain in posterior L neck that radiates to the left side of her head x 4-5 month. States it happens suddenly and only last a short time so she usually does not take medications for it. H/o migraines occasional, does not feel this is relates. Denies visual changes, focal weakness or numbness/tingling in hands.   HSV Recent HSV outbreak that came on suddenly. States she has had HSV since she was 32 years old but gets outbreaks fairly infrequently. Did not have any medication on hand. Thinks outbreak was triggered by increased stress.  Mood - anxiety Patient request list of counseling resources to help manage her anxiety. States she is a single mother to 5 children and her children's fathers do not help her. Has support in friendships but states she has a hard time managing everything. Open to medication but would like to discuss counseling services first.  PERTINENT  PMH / PSH: HSV  OBJECTIVE:   BP 122/82   Pulse 78   Ht  (1.575 m)   Wt 128 lb (58.1 kg)   SpO2 100%   BMI 23.41 kg/m    General: NAD, pleasant, able to participate in exam Respiratory: Normal WOB on RA MSK: L trapezius and L cervical paraspinal hypertonicity and TTP. No weakness or loss of sensation. Full ROM of neck with some pain. Negative Spurling Skin: Warm and dry, no rashes noted Neuro: Alert, no obvious focal deficits Psych: Normal affect and mood, tearful discussing anxiety  ASSESSMENT/PLAN:   HSV (herpes simplex virus) infection H/o of HSV with prior infrequent outbreaks. Recent outbreak this week, not treated since no medication on hand. Likely triggered by increased life stressors. -Valacyclovir  daily x 5 days during outbreak with many refills -Consider maintenance therapy if increasing frequency of outbreaks -Discuss HSV transmission and safe sexual practices  Anxious mood Single mother of 5 kids, some support from friends but  reports worsening anxiety. Would like to try counseling before pursuing medication options -List of Medicaid counseling services provided  Neck pain Intermittent, sharp L neck pain that radiates to L side of head occurring after neck movement. No visual changes or focal deficits. Likely muscle tension causing tension headache given trapezius and paraspinal hypertonicity upon exam. No taking medications due to short lived episodes -Refer to PT for neck exercise -Discussed utilizing massage, stretching and stress reduction for pain. Tylenol and Ibuprofen as needed.   Dr. Elberta Fortis, DO [facility]

## 2022-05-07 NOTE — Assessment & Plan Note (Signed)
Intermittent, sharp L neck pain that radiates to L side of head occurring after neck movement. No visual changes or focal deficits. Likely muscle tension causing tension headache given trapezius and paraspinal hypertonicity upon exam. No taking medications due to short lived episodes -Refer to PT for neck exercise -Discussed utilizing massage, stretching and stress reduction for pain. Tylenol and Ibuprofen as needed.

## 2022-05-07 NOTE — Assessment & Plan Note (Addendum)
H/o of HSV with prior infrequent outbreaks. Recent outbreak this week, not treated since no medication on hand. Likely triggered by increased life stressors. -Valacyclovir  daily x 5 days during outbreak with many refills -Consider maintenance therapy if increasing frequency of outbreaks -Discuss HSV transmission and safe sexual practices

## 2022-05-07 NOTE — Patient Instructions (Addendum)
It was wonderful to see you today! Thank you for choosing [facility].   Please bring ALL of your medications with you to every visit.   Today we talked about:  Please see the  list of counseling resources below! I think this is a great first step to taking care of your mental health. If you feel like you want to discuss medication management please let us know I sent in the Valacyclovir to take when you have herpes outbreaks. Please start taking it whenever you start to feel "tingling". Take 1 tablet for 5 days in a row when you feel the outbreak coming. I sent in many refills to the medication but if you are having outbreaks all the time please let us know because you might needed maintenance medications. I sent the referral to physical therapy for your neck. Please use Tylenol and Ibuprofen as needed for pain management. Our office will call about the referral.  Please follow up as needed for symptom management  If you haven't already, sign up for My Chart to have easy access to your labs results, and communication with your primary care physician.  Call the clinic at [phone] if your symptoms worsen or you have any concerns.  Please be sure to schedule follow up at the front desk before you leave today.   Elberta Fortis, DO Family Medicine     Psychiatry Resource List (Adults and Children) Most of these providers will take Medicaid. please consult your insurance for a complete and updated list of available providers. When calling to make an appointment have your insurance information available to confirm you are covered.   [facility]:Psychiatry and Counseling [address] [place], [place] [address] [phone]  [facility]  [address] [place], [place] [phone] Crisis [phone]   [facility]:   [facility]: [address]     [phone]   [facility]: [address] [place]. [place],         [phone] [place]: [address] [phone],    [phone] [place]: [phone] [address],                   [phone] Children: [facility] [address]         [phone]  [facility] (virtual only) [phone]   [facility]  (Psychiatry only; Adults /children 12 and over, will take Medicaid)  [address] Laurell Josephs [address], [place], [place] [address]       [phone]   [facility] (Psychiatry & counseling ; adults & children ; will take Medicaid [address]  [place] [address]  Go on-line to complete referral ( [email or web address]/make-[email or web address]-referral [phone]    (Spanish speaking therapists)  [facility]  Psychiatry & counseling; Adults and children;  Call Registration prior to scheduling an appointment [phone] [address]    [place], [place] [address]    [phone]  [facility] (Psychiatry & counseling; adults & children; Medicare no Medicaid)  [address]   [place], [place]  [address]      [phone]    [facility] (up to age 35)  Psychiatry & counseling ,will take Medicaid, must do counseling to receive psychiatry services  [address] [place] [address]        [phone]  [facility] (Psychiatry &  counseling; adults & children; will take Medicaid) Will need a referral from provider [address],  [place], [place]  [phone]   [facility] --- Walk-In Mon-Friday 8am-3pm ( will take Medicaid, Psychiatry, Adults & children,  [address], [place], [place]   [phone]   [facility] [place]--, Walk-in M-F 8am-12pm and 1pm -3pm   (Counseling, Psychiatry, will take Medicaid, adults & children)  [address], [place], [place]  [phone]

## 2022-05-07 NOTE — Assessment & Plan Note (Addendum)
Single mother of 5 kids, some support from friends but reports worsening anxiety. Would like to try counseling before pursuing medication options -List of Medicaid counseling services provided

## 2022-06-20 ENCOUNTER — Ambulatory Visit: Payer: Medicaid Other | Attending: Family Medicine | Admitting: Physical Therapy

## 2022-11-17 ENCOUNTER — Other Ambulatory Visit: Payer: Self-pay | Admitting: Student

## 2022-11-17 ENCOUNTER — Ambulatory Visit: Payer: Medicaid Other | Admitting: Family Medicine

## 2022-11-17 ENCOUNTER — Ambulatory Visit: Payer: Medicaid Other | Admitting: Student

## 2022-11-17 ENCOUNTER — Other Ambulatory Visit (HOSPITAL_COMMUNITY)
Admission: RE | Admit: 2022-11-17 | Discharge: 2022-11-17 | Disposition: A | Discharge status: Home / Self Care | Payer: Medicaid Other | Source: Ambulatory Visit | Attending: Family Medicine | Admitting: Family Medicine

## 2022-11-17 VITALS — BP 110/70 | HR 72 | Temp 98.4°F | Ht 62.0 in | Wt 137.8 lb

## 2022-11-17 DIAGNOSIS — F419 Anxiety disorder, unspecified: Secondary | ICD-10-CM

## 2022-11-17 DIAGNOSIS — N898 Other specified noninflammatory disorders of vagina: Secondary | ICD-10-CM | POA: Diagnosis not present

## 2022-11-17 DIAGNOSIS — Z975 Presence of (intrauterine) contraceptive device: Secondary | ICD-10-CM

## 2022-11-17 DIAGNOSIS — B9689 Other specified bacterial agents as the cause of diseases classified elsewhere: Secondary | ICD-10-CM

## 2022-11-17 DIAGNOSIS — T148XXA Other injury of unspecified body region, initial encounter: Secondary | ICD-10-CM | POA: Insufficient documentation

## 2022-11-17 DIAGNOSIS — O88219 Thromboembolism in pregnancy, unspecified trimester: Secondary | ICD-10-CM

## 2022-11-17 LAB — POCT WET PREP (WET MOUNT)
Clue Cells Wet Prep Whiff POC: POSITIVE
Trichomonas Wet Prep HPF POC: ABSENT

## 2022-11-17 MED ORDER — METRONIDAZOLE 500 MG PO TABS: 500.0000 mg | ORAL_TABLET | Freq: Two times a day (BID) | ORAL | 0 refills | Status: AC

## 2022-11-17 NOTE — Assessment & Plan Note (Addendum)
Strings in place

## 2022-11-17 NOTE — Assessment & Plan Note (Addendum)
Left iliopsoas tenderness over insertion point of iliac [place] muscle and the lesser trochanter.  Recommend conservative management.

## 2022-11-17 NOTE — Patient Instructions (Addendum)
It was great to see you today! Thank you for choosing [facility] for your primary care.  Today we addressed: I did not appreciate any lump in your groin, however your hip flexor is quite tender and I suspect you have a muscle strain.  Heat is best for muscle strains that are lasting longer than 48 hours, in addition to ibuprofen and sometimes over-the-counter Voltaren gel which is an anti-inflammatory cream can work as well. Your IUD strings are in place.  I have swabbed for STD screening today. I have attached a list of counseling resources based on your insurance below.  If you haven't already, sign up for My Chart to have easy access to your labs results, and communication with your primary care physician.  Return if symptoms worsen or fail to improve. Please arrive 15 minutes before your appointment to ensure smooth check in process.  We appreciate your efforts in making this happen.  Thank you for allowing me to participate in your care, Shelby Mattocks, DO 11/17/2022, 9:30 AM PGY-3,  Family Medicine     Therapy and Counseling Resources Most providers on this list will take Medicaid. Patients with commercial insurance or Medicare should contact their insurance company to get a list of in network providers.  [facility] (takes children) Location 1: [address] [place], [place] [address] Location 2: [address] [place], [place] [address] [phone]   [facility] (spanish speaking therapist available)(habla espanol)(take medicare and medicaid)  [address], [place], [place] [address], [email or web address] [email or web address] [phone]  [facility]:Psychiatry and Counseling [address] [place], [place] [address] [phone]  [facility]   [address] Suite [place], [place] [address]      [phone]  [facility] (spanish available) [address]  [place], [place] [address] [phone]  [facility] (take [facility] and medicare) [address]  [place], [place] [address]       [phone]     [facility] (virtual only) [phone]  Jovita Kussmaul [facility] [address], [place], [place] [phone]  [facility]:  [address] [place] [phone]  [facility]:  [address]ie Diener [phone]  [facility] (under & uninsured) [address]   [place] [phone]    [email or web address]    [facility] [phone] [address]  [place]    [phone]  [facility] -[address]     Phone:  [phone]     [facility]-  [address]  [phone]   [facility] [address]      [place], [place] [phone]  [facility]: [address] [place], [place], [place]  [phone]   [facility] (Spanish therapist) [email or web address]  [address] [place]  [address]   [place] [address]    [phone]    [facility]   [address],  [place], [place]  [phone]   [facility]  [address] [place]  [phone]  [facility]  [address] [place], [place]        [phone]  Open Access/Walk In Clinic under & uninsured  [facility]  [address] [place], [place] [phone] Crisis [phone]  [facility] the [address],  ([place])  [address], [place]: [phone]) 8:30 - 12; 1 - 2:30  [facility] of the [employer],  [address], [place]    [phone]):8:30 - 12; 2 - 3PM  [facility] [employer],  [address],  [place]; [phone]):   Mon - Fri 8 AM - 5 PM  [facility] [address] [place]  MWF 12:30 to 3:00 or call to schedule an appointment   [phone]  Specific Provider options Psychology Today  [email or web address]/us click on find a therapist  enter your zip code left side and select or tailor a therapist for your specific need.   [facility] Provider Directory [email or web address]  (Medicaid)   Follow all drop down to find a provider  Social Support program [facility] Health [place] [phone] or PhotoSolver.pl [address], [place], [place] Recovery support and educational   24- Hour Availability:   [facility]  [address] [place], [place] [phone] Crisis [phone]  [facility] the [facility] [phone]  [place] [facility]  [phone]   [facility]  [phone] (after hours)  Therapeutic Alternative/Mobile Crisis   [phone]  [place] National Suicide Hotline  [phone] Len Childs)  Call 911 or go to emergency room  [facility]  [phone]);  [place] and [employer]  [phone]); [place], [place], [place], [place], [place], [place], [place]

## 2022-11-17 NOTE — Assessment & Plan Note (Addendum)
Provided list of Medicaid counseling resources upon request.

## 2022-11-17 NOTE — Assessment & Plan Note (Addendum)
Patient requested recheck of this PE.  She is asymptomatic, no indications to workup.  Discussed radiation risk with her and recommended against imaging.

## 2022-11-17 NOTE — Progress Notes (Signed)
  SUBJECTIVE:   CHIEF COMPLAINT / HPI:   Lump in groin: She presents with concerns of lump in left groin which she noticed last Wednesday.  She states it was tender when she pushed on it but has not taken a look since then.  She also requests that her IUD strings be checked today.  She endorses some vaginal discharge that is white/yellow and denies any recent HSV outbreak.  She endorses monthly periods.  She is requesting resources to get set up with a Careers information officer.  PERTINENT  PMH / PSH: genital HSV, hx of chlamydia, Mirena in place  OBJECTIVE:  BP 110/70   Pulse 72   Temp 98.4 F (36.9 C)   Ht 5\' 2"  (1.575 m)   Wt 137 lb 12.8 oz (62.5 kg)   SpO2 99%   BMI 25.20 kg/m   Pelvic exam: VULVA: normal appearing vulva with no masses, tenderness or lesions, VAGINA: vaginal discharge - clear, CERVIX: normal appearing cervix without discharge or lesions, DNA probe for chlamydia and GC obtained, Nabothian cyst at 8 o'clock, exam chaperoned by Jake Seats, CMA.  MSK/lymph: No appreciable inguinal lymphadenopathy bilaterally, tenderness over left iliacus muscle closer to the insertion point at lesser trochanter  ASSESSMENT/PLAN:   Assessment & Plan Vaginal discharge Check for gonorrhea/chlamydia/trichomoniasis and BV/yeast.  Trace discharge present. IUD (intrauterine device) in place Strings in place Muscle strain Left iliopsoas tenderness over insertion point of iliac [place] muscle and the lesser trochanter.  Recommend conservative management. Pulmonary embolism affecting pregnancy, antepartum Patient requested recheck of this PE.  She is asymptomatic, no indications to workup.  Discussed radiation risk with her and recommended against imaging. Anxious mood Provided list of Medicaid counseling resources upon request.   Return if symptoms worsen or fail to improve. Shelby Mattocks, DO 11/17/2022, 10:05 AM PGY-3, [facility]

## 2022-11-17 NOTE — Assessment & Plan Note (Addendum)
Check for gonorrhea/chlamydia/trichomoniasis and BV/yeast.  Trace discharge present.

## 2022-11-18 ENCOUNTER — Telehealth: Payer: Self-pay | Admitting: Student

## 2022-11-18 LAB — CERVICOVAGINAL ANCILLARY ONLY
Chlamydia: NEGATIVE
Comment: NEGATIVE
Comment: NORMAL
Neisseria Gonorrhea: NEGATIVE

## 2022-11-18 NOTE — Telephone Encounter (Signed)
Informed patient of the results.  Notified that I sent Rx for metronidazole 500 mg twice daily x 7 days.  I will follow-up with her with other lab testing when results return.

## 2022-11-19 ENCOUNTER — Encounter: Payer: Self-pay | Admitting: Student

## 2022-11-20 ENCOUNTER — Encounter: Payer: Self-pay | Admitting: Student

## 2023-02-16 NOTE — Progress Notes (Deleted)
    SUBJECTIVE:   CHIEF COMPLAINT / HPI:   Headaches  *Pneumonia vaccine  PERTINENT  PMH / PSH: ***  OBJECTIVE:   There were no vitals taken for this visit. ***  General: NAD, pleasant, able to participate in exam Cardiac: RRR, no murmurs. Respiratory: CTAB, normal effort, No wheezes, rales or rhonchi Abdomen: Bowel sounds present, nontender, nondistended Extremities: no edema or cyanosis. Skin: warm and dry, no rashes noted Neuro: alert, no obvious focal deficits Psych: Normal affect and mood  ASSESSMENT/PLAN:   No problem-specific Assessment & Plan notes found for this encounter.     Dr. Elberta Fortis, DO [facility]    {    This will disappear when note is signed, click to select method of visit    :1}

## 2023-02-17 ENCOUNTER — Ambulatory Visit: Payer: Medicaid Other | Admitting: Family Medicine

## 2023-02-22 ENCOUNTER — Ambulatory Visit: Payer: Medicaid Other | Admitting: Student

## 2023-02-22 ENCOUNTER — Encounter: Payer: Self-pay | Admitting: Student

## 2023-02-22 VITALS — BP 113/71 | HR 94 | Ht 62.0 in | Wt 139.0 lb

## 2023-02-22 DIAGNOSIS — R519 Headache, unspecified: Secondary | ICD-10-CM | POA: Diagnosis not present

## 2023-02-22 NOTE — Progress Notes (Signed)
    SUBJECTIVE:   CHIEF COMPLAINT / HPI:   Sonya Foster is a 33 year-old female here with headaches intermittently for 2 weeks in January.  She says they were small, sharp pains that would make her have to stop whatever she was doing. Reports a little bit of nausea. No vomiting. Laying down did not help.  She says she has had some blurred vision in her left eye. 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  She says she gets dark spots in her eyes at times.  She smokes marijuana daily.   She says when she takes a deep breath, her head feels funny and feels tight. When she was pregnant with her last child, she had a PE. She took blood thinners her whole pregnancy.  PERTINENT  PMH / PSH:  Hx PE IUD in place  OBJECTIVE:   BP 113/71   Pulse 94   Ht 5' 2 (1.575 m)   Wt 139 lb (63 kg)   LMP 01/24/2023   SpO2 98%   BMI 25.42 kg/m   General: NAD, well appearing Cardiac: RRR Neuro: A&O Respiratory: normal WOB on RA. No wheezing or crackles on auscultation, good lung sounds throughout Extremities: Moving all 4 extremities equally Neuro: A&O x 4.  Speech is clear and fluent.  EOMI.  Cranial nerves II through XII intact  ASSESSMENT/PLAN:   Frequent headaches Reported worsening of headaches over the past couple weeks. She is well-appearing, in no distress with no focal neurological deficits on exam today. Given history of chronic left eye blurred vision, use of corrective lenses, encouraged her to return to her ophthalmologist/optometrist for an eye exam as the first step. Very likely could be stress related, as she reports financial difficulty, has 5 children at home, and says that she needs to see a therapist for her anxiety. She does have a history of PE in pregnancy, which does raise the question of a hypercoagulability and should consider venous sinus thrombosis. Can consider MRI if headaches persist.  No urgent need for imaging today. ER and return  precautions discussed.     Earnesteen Birnie, DO [facility]

## 2023-02-22 NOTE — Patient Instructions (Addendum)
It was great seeing you today.  As we discussed, -Keep a headache diary of when your headaches are occurring. -Please schedule appointment with her eye doctor for an eye exam. -I provided you a list of therapy resources seen below. -If you have any changes in your symptoms including worsening headache, nausea or vomiting, confusion then he should go to the ER for evaluation.   If you have any questions or concerns, please feel free to call the clinic.   Have a wonderful day,  Dr. Darral Dash [facility] [phone]   Psychiatry Resource List (Adults and Children) Most of these providers will take Medicaid. please consult your insurance for a complete and updated list of available providers. When calling to make an appointment have your insurance information available to confirm you are covered.   [facility]:Psychiatry and Counseling [address] [facility] [address] [place], [place] [address] [phone]  [facility]  [address] [place], [place] [phone] Crisis [phone]   [facility]:   [facility]: [address]     [phone]   [facility]: [address] [place]. [place],        [phone] [place]: [address] [phone],    [phone] [place]: [phone] [address],                   [phone] Children: [facility] [address]         [phone]  [facility] (virtual only) [phone]   [facility]  (Psychiatry only; Adults /children 12 and over, will take Medicaid)  [address] [place] [address] Laurell Josephs [address], [place], [place] [address]       [phone]   [facility] (Psychiatry & counseling ; adults & children ; will take Medicaid [address]  [place] [address]  Go on-line to complete referral ( [email or web address]/en/make-[email or web address]-referral [phone]    (Spanish speaking  therapists)  [facility]  Psychiatry & counseling; Adults and children;  Call Registration prior to scheduling an appointment [phone] [address]    [place], [place] [address]    [phone]  [facility] (Psychiatry & counseling; adults & children; Medicare no Medicaid)  [address]   [place], [place]  [address]      [phone]    [facility] (up to age 37)  Psychiatry & counseling ,will take Medicaid, must do counseling to receive psychiatry services  [address] [place] [address]        [phone]  [facility] (Psychiatry & counseling; adults & children; will take Medicaid) Will need a referral from provider [address],  [place], [place]  [phone]   [facility] --- Walk-In Mon-Friday 8am-3pm ( will take Medicaid, Psychiatry, Adults & children,  [address] [place], [place]   [phone]   [facility] [place]--, Walk-in M-F 8am-12pm and 1pm -3pm   (Counseling, Psychiatry, will take Medicaid, adults & children)  [address], [place], [place]  [phone]

## 2023-02-22 NOTE — Assessment & Plan Note (Signed)
Reported worsening of headaches over the past couple weeks. She is well-appearing, in no distress with no focal neurological deficits on exam today. Given history of chronic left eye blurred vision, use of corrective lenses, encouraged her to return to her ophthalmologist/optometrist for an eye exam as the first step. Very likely could be stress related, as she reports financial difficulty, has 5 children at home, and says that she needs to see a therapist for her anxiety. She does have a history of PE in pregnancy, which does raise the question of a hypercoagulability and should consider venous sinus thrombosis. Can consider MRI if headaches persist.  No urgent need for imaging today. ER and return precautions discussed.

## 2023-03-18 ENCOUNTER — Encounter: Payer: Self-pay | Admitting: Student

## 2023-03-18 ENCOUNTER — Ambulatory Visit (INDEPENDENT_AMBULATORY_CARE_PROVIDER_SITE_OTHER): Payer: Medicaid Other | Admitting: Student

## 2023-03-18 VITALS — BP 119/85 | HR 89 | Ht 62.0 in | Wt 137.0 lb

## 2023-03-18 DIAGNOSIS — R519 Headache, unspecified: Secondary | ICD-10-CM | POA: Diagnosis not present

## 2023-03-18 DIAGNOSIS — R239 Unspecified skin changes: Secondary | ICD-10-CM

## 2023-03-18 NOTE — Patient Instructions (Signed)
 It was great seeing you today.  As we discussed, - Therapy is very important to help you through your stressful time. See list below- please schedule an appointment. - Please go see your eye doctor. - Your bump on your belly does not feel concerning. If it grows or changes, let us know   If you have any questions or concerns, please feel free to call the clinic.   Have a wonderful day,  Dr. Darral Dash [facility] [phone]      Therapy and Counseling Resources Most providers on this list will take Medicaid. Patients with commercial insurance or Medicare should contact their insurance company to get a list of in network providers.  [employer] (takes children) Location 1: [address] [place], [place] [address] Location 2: [address] [place], [place] [address] [phone]   [facility] (spanish speaking therapist available)(habla espanol)(take medicare and medicaid)  [address], [place], [place] [address], [place] [email or web address] [phone]  [facility]:Psychiatry and Counseling [address] [facility] [address] [place], [place] [address] [phone]  [facility]   [address], Suite [place], [place] [address]      [phone]  [facility] (spanish available) [address]  [place], [place] [address] [phone]  [facility] (take [facility] and medicare) [address]  [place], [place] [address]       [phone]     [facility] (virtual only) [phone]  Jovita Kussmaul [facility] [address], [place], [place] [phone]  [facility]:  [address] [place] [phone]  [facility]:  [address] [place]ie [place]iener [phone]  [facility] (under & uninsured) [address]   [place] [phone]    [email or web address]    [facility] [address]  [place]    [phone]  [facility] -[address]     Phone:  [phone]     [facility]-  [address]  [phone]   [facility] [address]      [place], [place] [phone]  [facility]: [address] [place], [place], [place]  [phone]   [facility] (Spanish therapist) [email or web address]  [address]   [place] [address]    [phone]    [facility]   [address],  [place], [place]  [phone]   [facility]  [address] [place]  [phone]  [facility]  [address] [place], [place]        [phone]  Open Access/Walk In Clinic under & uninsured  [facility]  [address] [place], [place] [phone] Crisis [phone]  [facility] [place],  (Spanish)   [address], [place]: [phone]) 8:30 - 12; 1 - 2:30  [facility] of [facility] [employer],  [address] [place], [place]    [phone]):8:30 - 12; 2 - 3PM  [facility] [employer],  [address],  [place]; [phone]):   Mon - Fri 8 AM - 5 PM  [facility] [address] [place]  MWF 12:30 to 3:00 or call to schedule an appointment  [phone]  Specific Provider options Psychology Today  [email or web address].com/us click on find a therapist  enter your zip code left side and select or tailor a therapist for your specific need.   [facility] Provider Directory [email or web address]  (Medicaid)   Follow all drop down to find a provider  Social Support program Mental Health [place] [phone] or PhotoSolver.pl [address], [place], [place] Recovery support and educational   24- Hour Availability:   [facility]  [address] [place], [place] [phone] Crisis [phone]  [facility] [phone]  [facility]  [phone]   [facility]  [phone] (after hours)  Therapeutic Alternative/Mobile Crisis   [phone]  [place] National Suicide Hotline  [phone] Len Childs)  Call 911 or go to emergency room  [facility]  [phone]);  [place] and [employer]  [phone]); [place], [place], [place], [place], [place], [place], [place]

## 2023-03-18 NOTE — Assessment & Plan Note (Signed)
 Much worsened by stress. Normal neuro exam, no red flag signs or symptoms Discussed deep breathing, NSAIDs (not to be overused), hydration Therapy list provided Needs to see ophthalmologist as well Discussed mind body connection

## 2023-03-18 NOTE — Progress Notes (Signed)
    SUBJECTIVE:   CHIEF COMPLAINT / HPI:   Sonya Foster is a 33 year-old female here to discuss headaches.  Had a bad headache yesterday, resolved with ibuprofen. Having big stress right now. Stressors: Transmission out on car, Husband just got out of prison and getting divorced. Has 5 kids and feels like she does everything herself. She only eats once a day or so- had a few chicken nuggets this morning.  Noticed a small spot on her lower left abdomen. Felt like a small bump under her skin that concerned her. Started a few weeks ago. No overlying erythema, drainage.  PERTINENT  PMH / PSH:    OBJECTIVE:   BP 119/85   Pulse 89   Ht 5\' 2"  (1.575 m)   Wt 137 lb (62.1 kg)   LMP 02/24/2023   SpO2 100%   BMI 25.06 kg/m   General: NAD, well appearing Cardiac: RRR Neuro: A&O. CN 2-12 intact.  Respiratory: normal WOB on RA. No wheezing or crackles on auscultation, good lung sounds throughout Extremities: Moving all 4 extremities equally Skin: Mobile 0.5 cm soft cyst inferior and lateral to umbilicus   ASSESSMENT/PLAN:   Frequent headaches Much worsened by stress. Normal neuro exam, no red flag signs or symptoms Discussed deep breathing, NSAIDs (not to be overused), hydration Therapy list provided Needs to see ophthalmologist as well Discussed mind body connection  Skin complaints Very small palpable, easily mobile and soft 0.5 cm cyst inferior to umbilicus No concerning features, monitor for size No concern for cellulitis, other infectious process     Darral Dash, DO [facility]

## 2023-03-19 DIAGNOSIS — R239 Unspecified skin changes: Secondary | ICD-10-CM | POA: Insufficient documentation

## 2023-03-19 NOTE — Assessment & Plan Note (Addendum)
 Very small palpable, easily mobile and soft 0.5 cm cyst inferior to umbilicus No concerning features, monitor for size No concern for cellulitis, other infectious process

## 2023-04-21 ENCOUNTER — Ambulatory Visit: Payer: Medicaid Other | Admitting: Student

## 2023-05-11 ENCOUNTER — Ambulatory Visit: Admitting: Student

## 2023-05-11 VITALS — BP 100/75 | HR 73 | Ht 62.0 in | Wt 141.8 lb

## 2023-05-11 DIAGNOSIS — F419 Anxiety disorder, unspecified: Secondary | ICD-10-CM

## 2023-05-11 DIAGNOSIS — R519 Headache, unspecified: Secondary | ICD-10-CM

## 2023-05-11 NOTE — Assessment & Plan Note (Addendum)
 Stable. Not requiring medication at this time. Provided handout of Medicaid therapy resources, 988 number. Discussed mindfulness techniques She is to pursue family counseling as well No food or financial instability F/u PRN for this issue with PCP

## 2023-05-11 NOTE — Progress Notes (Signed)
    SUBJECTIVE:   CHIEF COMPLAINT / HPI:   Sonya Foster is a 33 year-old female here for f/u of anxious mood and headaches.  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 She reports no more headaches- thinks they may have been related to her job stress  Has court coming up for divorce- looking forward to finalizing. No SI/HI. Home life is going well- kids are doing well.   PERTINENT  PMH / PSH:  Hx PE in pregnancy Neck pain Frequent chronic headaches Hx anxious mood  OBJECTIVE:   BP 100/75   Pulse 73   Ht 5\' 2"  (1.575 m)   Wt 141 lb 12.8 oz (64.3 kg)   SpO2 99%   BMI 25.94 kg/m   General: Well-nourished, no distress, ambulates without difficulty HEENT: MMM. Wears corrective lenses. EOMI. CV: RRR Resp: Normal effort on room air Psych: Pleasant. Normal mood and affect.   ASSESSMENT/PLAN:   Anxious mood Stable. Not requiring medication at this time. Provided handout of Medicaid therapy resources, 988 number. Discussed mindfulness techniques She is to pursue family counseling as well No food or financial instability F/u PRN for this issue with PCP   If patient desiring pregnancy in near future, would need MFM pre-conception counseling given hx PE in prior pregnancy.  Sonya Stead, DO [facility]

## 2023-05-11 NOTE — Patient Instructions (Addendum)
 It was great seeing you today.  As we discussed, - So happy you are doing better! Return in 3 months for follow up with Dr. Ival Marines so we can see how you are doing.   If you have any questions or concerns, please feel free to call the clinic.   Have a wonderful day,  Dr. Parv Manthey [facility] [phone]      Therapy and Counseling Resources Most providers on this list will take Medicaid. Patients with commercial insurance or Medicare should contact their insurance company to get a list of in network providers.  [facility] (takes children) Location 1: [address] [place], [place] [address] Location 2: [address] [place], [place] [address] [phone]   [facility] (spanish speaking therapist available)(habla espanol)(take medicare and medicaid)  [address], [place], [place] [address], [place]  [email or web address] [phone]  [facility]:Psychiatry and Counseling [address] [facility] [address] [place], [place] [address] [phone]  [facility]   [address] [place], [place] [address]      [phone]  [facility] (spanish available) [address]  [place], [place] [address] [phone]  [facility] (take [facility] and medicare) [address]  [place], [place] [address]       [phone]     [facility] (virtual only) [phone]  Arnold Bicker [facility] [address] [place], [place] [phone]  [facility]:  [address] [place] [phone]  [facility]:  [address] [place] [phone]  [facility] (under & uninsured) [address]   [place] [phone]    [email or web address]    [facility] [address]  [place]    [phone]  [facility] -[address]     Phone:  [phone]     [facility]-  [address]  [phone]   [facility] [address]      [place], [place] [phone]  [facility]: [address] [place], [place], [place]  [phone]   [facility] (Spanish therapist) [email or web address]  [address] [place]  [address]   [place] [address]    [phone]    [facility]   [address],  [place], [place]  [phone]   [facility]  [address] [place]  [phone]  [facility]  [address] [place], [place]        [phone]  Open Access/Walk In Clinic under & uninsured  [facility]  [address] [place], [place] [phone] Crisis [phone]  [facility] of the [address],  (Spanish)   [address] , [place]: [phone]) 8:30 - 12; 1 - 2:30  [facility] [employer],  [address] [place], [place]    [phone]):8:30 - 12; 2 - 3PM  [facility] [employer],  [address],  [place]; [phone]):   Mon - Fri 8 AM - 5 PM  [facility] [address] [place]  MWF 12:30 to 3:00 or call to schedule an appointment  [phone]  Specific Provider options Psychology Today  [email or web address].psychologytoday.com/us  click on find a therapist  enter your zip code left side and select or tailor a therapist for your specific need.   [employer]  Center Provider Directory [email or web address]  (Medicaid)   Follow all drop down to find a provider  Social Support program Mental Health [place] [phone] or PhotoSolver.pl [address], [place], [place] Recovery support and educational   24- Hour Availability:   [facility]  [address] [place], [place] [phone] Crisis [phone]  [facility]  [phone]  [place] [facility]  [phone]   [facility]  [phone] (after hours)  Therapeutic Alternative/Mobile Crisis   [phone]  [place]  National Suicide Hotline  [phone] Derrel Flies)  Call 911 or go to emergency room  [facility]  [phone]);  [place] and [employer]  [phone]); [place], [place], [place], [place], [place], [place], [place]
